# Patient Record
Sex: Female | Born: 1985 | Race: Asian | Hispanic: Yes | Marital: Married
Health system: Southern US, Community
[De-identification: ages and names within clinical notes are randomized; demographics above are authoritative.]

## PROBLEM LIST (undated history)

## (undated) DIAGNOSIS — O09299 Supervision of pregnancy with other poor reproductive or obstetric history, unspecified trimester: Secondary | ICD-10-CM

## (undated) DIAGNOSIS — D649 Anemia, unspecified: Secondary | ICD-10-CM

## (undated) DIAGNOSIS — T8859XA Other complications of anesthesia, initial encounter: Secondary | ICD-10-CM

## (undated) DIAGNOSIS — R87629 Unspecified abnormal cytological findings in specimens from vagina: Secondary | ICD-10-CM

## (undated) DIAGNOSIS — O24419 Gestational diabetes mellitus in pregnancy, unspecified control: Secondary | ICD-10-CM

## (undated) HISTORY — DX: Supervision of pregnancy with other poor reproductive or obstetric history, unspecified trimester: O09.299

## (undated) HISTORY — DX: Other complications of anesthesia, initial encounter: T88.59XA

## (undated) HISTORY — PX: WISDOM TOOTH EXTRACTION: SHX21

## (undated) HISTORY — DX: Gestational diabetes mellitus in pregnancy, unspecified control: O24.419

## (undated) HISTORY — DX: Unspecified abnormal cytological findings in specimens from vagina: R87.629

## (undated) HISTORY — DX: Anemia, unspecified: D64.9

## (undated) HISTORY — PX: CERVICAL BIOPSY: SHX590

---

## 2017-12-14 NOTE — L&D Delivery Note (Signed)
Delivery Note Pt pushed well for 2hour and 20 minutes.  At 5:23 AM a viable and healthy female was delivered via Vaginal, Spontaneous (Presentation:OA ; LOT  ).  APGAR: 8, 9; weight  P.   Placenta status: delivered, intact .  Cord: 3V with the following complications: none.    Anesthesia:  epidural Episiotomy:  none Lacerations: 2nd degree;Perineal Suture Repair: 3.0 vicryl rapide Est. Blood Loss (mL): 246cc  Mom to postpartum.  Baby to Couplet care / Skin to Skin.  Tenae Graziosi Bovard-Stuckert 09/17/2018, 5:56 AM  Br/Tdap in PNC/RI/B+/Contra ?

## 2018-02-09 LAB — OB RESULTS CONSOLE RPR: RPR: NONREACTIVE

## 2018-02-09 LAB — OB RESULTS CONSOLE ABO/RH: RH TYPE: POSITIVE

## 2018-02-09 LAB — OB RESULTS CONSOLE GC/CHLAMYDIA
Chlamydia: NEGATIVE
GC PROBE AMP, GENITAL: NEGATIVE

## 2018-02-09 LAB — OB RESULTS CONSOLE HEPATITIS B SURFACE ANTIGEN: HEP B S AG: NEGATIVE

## 2018-02-09 LAB — OB RESULTS CONSOLE RUBELLA ANTIBODY, IGM: RUBELLA: IMMUNE

## 2018-02-09 LAB — OB RESULTS CONSOLE HIV ANTIBODY (ROUTINE TESTING): HIV: NONREACTIVE

## 2018-02-09 LAB — OB RESULTS CONSOLE ANTIBODY SCREEN: ANTIBODY SCREEN: NEGATIVE

## 2018-08-23 LAB — OB RESULTS CONSOLE GBS: GBS: NEGATIVE

## 2018-09-08 ENCOUNTER — Encounter (HOSPITAL_COMMUNITY): Payer: Self-pay | Admitting: *Deleted

## 2018-09-08 ENCOUNTER — Telehealth (HOSPITAL_COMMUNITY): Payer: Self-pay | Admitting: *Deleted

## 2018-09-08 NOTE — Telephone Encounter (Signed)
Preadmission screen  

## 2018-09-13 ENCOUNTER — Inpatient Hospital Stay (HOSPITAL_COMMUNITY)
Admission: AD | Admit: 2018-09-13 | Payer: BLUE CROSS/BLUE SHIELD | Source: Ambulatory Visit | Admitting: Obstetrics and Gynecology

## 2018-09-15 DIAGNOSIS — O480 Post-term pregnancy: Secondary | ICD-10-CM | POA: Diagnosis present

## 2018-09-15 NOTE — H&P (Signed)
Diane Jackson is a 32 y.o. female  G1P0000 at 53+ for IOL given term status and favorable cervix.  IOL with AROM and pitocin.  D/w pt r/b/a Elevated glucola - nl 3 hr GTT . OB History    Gravida  1   Para      Term      Preterm      AB      Living        SAB      TAB      Ectopic      Multiple      Live Births            + abn pap No STD Past Medical History:  Diagnosis Date  . Vaginal Pap smear, abnormal    . Family History: HTN, DM, CAD. Social History:  has no tobacco, alcohol, and drug history on file.married, Art gallery manager - [employer]  Meds Flexeril prn, PNV All Sulfa     Maternal Diabetes: No Genetic Screening: Normal Maternal Ultrasounds/Referrals: Normal Fetal Ultrasounds or other Referrals:  None Maternal Substance Abuse:  No Significant Maternal Medications:  None Significant Maternal Lab Results:  Lab values include: Group B Strep negative Other Comments:  Tdap in [facility]  Review of Systems  Constitutional: Negative.   HENT: Negative.   Eyes: Negative.   Respiratory: Negative.   Cardiovascular: Negative.   Gastrointestinal: Positive for abdominal pain.  Genitourinary: Positive for flank pain.  Skin: Negative.   Neurological: Negative.   Psychiatric/Behavioral: Negative.    Maternal Medical History:  Contractions: Frequency: regular.    Fetal activity: Perceived fetal activity is normal.    Prenatal Complications - Diabetes: none.      Last menstrual period 12/07/2017. Maternal Exam:  Abdomen: Patient reports no abdominal tenderness. Fundal height is appropriate for gestation.   Estimated fetal weight is 7.5-8.5#.   Fetal presentation: vertex  Introitus: Normal vulva. Normal vagina.    Physical Exam  Constitutional: She appears well-developed and well-nourished.  HENT:  Head: Normocephalic and atraumatic.  Cardiovascular: Normal rate and regular rhythm.  Respiratory: Breath sounds normal. No respiratory  distress.  GI: Soft. Bowel sounds are normal.    Prenatal labs: ABO, Rh: B/Positive/-- (02/27 0000) Antibody: Negative (02/27 0000) Rubella: Immune (02/27 0000) RPR: Nonreactive (02/27 0000)  HBsAg: Negative (02/27 0000)  HIV: Non-reactive (02/27 0000)  GBS:   neg  Hgb  13.8/Plt 324/Ur cx neg/GC ne/Chl neg/Varicella immune/nl Hgb electro/  Dated by LMP cw early [place] Nl NT Tdap 7/9 glucola 170, nl 3hr nlg Hgb electro/Pap WNL/  Nl anat x B CP cysts, female, post plac  Assessment/Plan: SVE 2/70/-2  32yo G1P0000 at 40+ for IOL given term and favorable Neg GBBS no prophylaxis IOL w AROM/pitocin  Epidural vs IV pain meds prn   Zeanna Sunde Bovard-Stuckert 09/15/2018, 9:58 PM

## 2018-09-16 ENCOUNTER — Inpatient Hospital Stay (HOSPITAL_COMMUNITY)
Admission: RE | Admit: 2018-09-16 | Discharge: 2018-09-19 | DRG: 807 | Disposition: A | Discharge status: Home / Self Care | Payer: BLUE CROSS/BLUE SHIELD | Attending: Obstetrics and Gynecology | Admitting: Obstetrics and Gynecology

## 2018-09-16 ENCOUNTER — Other Ambulatory Visit: Payer: Self-pay

## 2018-09-16 ENCOUNTER — Encounter (HOSPITAL_COMMUNITY): Payer: Self-pay

## 2018-09-16 ENCOUNTER — Encounter (HOSPITAL_COMMUNITY): Payer: Self-pay | Admitting: Anesthesiology

## 2018-09-16 ENCOUNTER — Inpatient Hospital Stay (HOSPITAL_COMMUNITY): Payer: BLUE CROSS/BLUE SHIELD | Admitting: Anesthesiology

## 2018-09-16 DIAGNOSIS — O480 Post-term pregnancy: Principal | ICD-10-CM | POA: Diagnosis present

## 2018-09-16 DIAGNOSIS — Z3A40 40 weeks gestation of pregnancy: Secondary | ICD-10-CM | POA: Diagnosis not present

## 2018-09-16 DIAGNOSIS — O9902 Anemia complicating childbirth: Secondary | ICD-10-CM | POA: Diagnosis present

## 2018-09-16 DIAGNOSIS — D649 Anemia, unspecified: Secondary | ICD-10-CM | POA: Diagnosis present

## 2018-09-16 LAB — PROTEIN / CREATININE RATIO, URINE
CREATININE, URINE: 39 mg/dL
Protein Creatinine Ratio: 2.21 mg/mg{Cre} — ABNORMAL HIGH (ref 0.00–0.15)
Total Protein, Urine: 86 mg/dL

## 2018-09-16 LAB — COMPREHENSIVE METABOLIC PANEL
ALT: 11 U/L (ref 0–44)
AST: 19 U/L (ref 15–41)
Albumin: 2.9 g/dL — ABNORMAL LOW (ref 3.5–5.0)
Alkaline Phosphatase: 161 U/L — ABNORMAL HIGH (ref 38–126)
Anion gap: 9 (ref 5–15)
BILIRUBIN TOTAL: 0.2 mg/dL — AB (ref 0.3–1.2)
BUN: 10 mg/dL (ref 6–20)
CO2: 20 mmol/L — ABNORMAL LOW (ref 22–32)
CREATININE: 0.64 mg/dL (ref 0.44–1.00)
Calcium: 9 mg/dL (ref 8.9–10.3)
Chloride: 106 mmol/L (ref 98–111)
GFR calc Af Amer: >60 mL/min (ref >60)
GFR calc non Af Amer: >60 mL/min (ref >60)
GLUCOSE: 74 mg/dL (ref 70–99)
Potassium: 5 mmol/L (ref 3.5–5.1)
Sodium: 135 mmol/L (ref 135–145)
Total Protein: 6.6 g/dL (ref 6.5–8.1)

## 2018-09-16 LAB — TYPE AND SCREEN
ABO/RH(D): B POS
Antibody Screen: NEGATIVE

## 2018-09-16 LAB — ABO/RH: ABO/RH(D): B POS

## 2018-09-16 LAB — CBC
HCT: 32.5 % — ABNORMAL LOW (ref 36.0–46.0)
Hemoglobin: 10 g/dL — ABNORMAL LOW (ref 12.0–15.0)
MCH: 22.8 pg — AB (ref 26.0–34.0)
MCHC: 30.8 g/dL (ref 30.0–36.0)
MCV: 74 fL — ABNORMAL LOW (ref 78.0–100.0)
Platelets: 211 10*3/uL (ref 150–400)
RBC: 4.39 MIL/uL (ref 3.87–5.11)
RDW: 17.5 % — ABNORMAL HIGH (ref 11.5–15.5)
WBC: 10.9 10*3/uL — AB (ref 4.0–10.5)

## 2018-09-16 MED ORDER — LIDOCAINE HCL (PF) 1 % IJ SOLN
INTRAMUSCULAR | Status: DC | PRN
  Administered 2018-09-16 (×2): 4 mL via EPIDURAL

## 2018-09-16 MED ORDER — EPHEDRINE 5 MG/ML INJ
10.0000 mg | INTRAVENOUS | Status: DC | PRN
  Filled 2018-09-16: qty 2

## 2018-09-16 MED ORDER — LACTATED RINGERS IV SOLN
500.0000 mL | INTRAVENOUS | Status: DC | PRN
  Administered 2018-09-16: 500 mL via INTRAVENOUS

## 2018-09-16 MED ORDER — OXYCODONE-ACETAMINOPHEN 5-325 MG PO TABS: 1.0000 | ORAL_TABLET | ORAL | Status: DC | PRN

## 2018-09-16 MED ORDER — FENTANYL 2.5 MCG/ML BUPIVACAINE 1/10 % EPIDURAL INFUSION (WH - ANES)
14.0000 mL/h | INTRAMUSCULAR | Status: DC | PRN
  Administered 2018-09-16 – 2018-09-17 (×4): 14 mL/h via EPIDURAL
  Filled 2018-09-16 (×4): qty 100

## 2018-09-16 MED ORDER — SOD CITRATE-CITRIC ACID 500-334 MG/5ML PO SOLN: 30.0000 mL | ORAL | Status: DC | PRN

## 2018-09-16 MED ORDER — PHENYLEPHRINE 40 MCG/ML (10ML) SYRINGE FOR IV PUSH (FOR BLOOD PRESSURE SUPPORT)
80.0000 ug | PREFILLED_SYRINGE | INTRAVENOUS | Status: DC | PRN
  Filled 2018-09-16: qty 5

## 2018-09-16 MED ORDER — ONDANSETRON HCL 4 MG/2ML IJ SOLN: 4.0000 mg | Freq: Four times a day (QID) | INTRAMUSCULAR | Status: DC | PRN

## 2018-09-16 MED ORDER — LACTATED RINGERS IV SOLN
500.0000 mL | Freq: Once | INTRAVENOUS | Status: AC
  Administered 2018-09-16: 500 mL via INTRAVENOUS

## 2018-09-16 MED ORDER — OXYTOCIN BOLUS FROM INFUSION
500.0000 mL | Freq: Once | INTRAVENOUS | Status: AC
  Administered 2018-09-17: 500 mL via INTRAVENOUS

## 2018-09-16 MED ORDER — LACTATED RINGERS IV SOLN
INTRAVENOUS | Status: DC
  Administered 2018-09-16 (×3): via INTRAVENOUS

## 2018-09-16 MED ORDER — LACTATED RINGERS IV SOLN: 500.0000 mL | Freq: Once | INTRAVENOUS | Status: DC

## 2018-09-16 MED ORDER — PHENYLEPHRINE 40 MCG/ML (10ML) SYRINGE FOR IV PUSH (FOR BLOOD PRESSURE SUPPORT)
80.0000 ug | PREFILLED_SYRINGE | INTRAVENOUS | Status: DC | PRN
  Filled 2018-09-16: qty 5
  Filled 2018-09-16: qty 10

## 2018-09-16 MED ORDER — TERBUTALINE SULFATE 1 MG/ML IJ SOLN
0.2500 mg | Freq: Once | INTRAMUSCULAR | Status: DC | PRN
  Filled 2018-09-16: qty 1

## 2018-09-16 MED ORDER — OXYTOCIN 40 UNITS IN LACTATED RINGERS INFUSION - SIMPLE MED
2.5000 [IU]/h | INTRAVENOUS | Status: DC
  Filled 2018-09-16: qty 1000

## 2018-09-16 MED ORDER — OXYTOCIN 40 UNITS IN LACTATED RINGERS INFUSION - SIMPLE MED
1.0000 m[IU]/min | INTRAVENOUS | Status: DC
  Administered 2018-09-16: 2 m[IU]/min via INTRAVENOUS
  Filled 2018-09-16: qty 1000

## 2018-09-16 MED ORDER — ACETAMINOPHEN 325 MG PO TABS
650.0000 mg | ORAL_TABLET | ORAL | Status: DC | PRN
  Administered 2018-09-17: 650 mg via ORAL
  Filled 2018-09-16: qty 2

## 2018-09-16 MED ORDER — LIDOCAINE HCL (PF) 1 % IJ SOLN
30.0000 mL | INTRAMUSCULAR | Status: AC | PRN
  Administered 2018-09-17: 30 mL via SUBCUTANEOUS
  Filled 2018-09-16: qty 30

## 2018-09-16 MED ORDER — OXYCODONE-ACETAMINOPHEN 5-325 MG PO TABS: 2.0000 | ORAL_TABLET | ORAL | Status: DC | PRN

## 2018-09-16 MED ORDER — DIPHENHYDRAMINE HCL 50 MG/ML IJ SOLN: 12.5000 mg | INTRAMUSCULAR | Status: DC | PRN

## 2018-09-16 NOTE — Anesthesia Procedure Notes (Signed)
Epidural Patient location during procedure: OB Start time: 09/16/2018 11:57 AM  Staffing Anesthesiologist: Mal Amabile, MD Performed: anesthesiologist   Preanesthetic Checklist Completed: patient identified, site marked, surgical consent, pre-op evaluation, timeout performed, IV checked, risks and benefits discussed and monitors and equipment checked  Epidural Patient position: sitting Prep: site prepped and draped and DuraPrep Patient monitoring: continuous pulse ox and blood pressure Approach: midline Location: L3-L4 Injection technique: LOR air  Needle:  Needle type: Tuohy  Needle gauge: 17 G Needle length: 9 cm and 9 Needle insertion depth: 4 cm Catheter type: closed end flexible Catheter size: 19 Gauge Catheter at skin depth: 9 cm Test dose: negative and Other  Assessment Events: blood not aspirated, injection not painful, no injection resistance, negative IV test and no paresthesia  Additional Notes Patient identified. Risks and benefits discussed including failed block, incomplete  Pain control, post dural puncture headache, nerve damage, paralysis, blood pressure Changes, nausea, vomiting, reactions to medications-both toxic and allergic and post Partum back pain. All questions were answered. Patient expressed understanding and wished to proceed. Sterile technique was used throughout procedure. Epidural site was Dressed with sterile barrier dressing. No paresthesias, signs of intravascular injection Or signs of intrathecal spread were encountered.  Patient was more comfortable after the epidural was dosed. Please see RN's note for documentation of vital signs and FHR which are stable.

## 2018-09-16 NOTE — Progress Notes (Addendum)
Patient ID: Diane Jackson, female   DOB: 1986-04-22, 32 y.o.   MRN: [id number]   Comfortable with epidural  AFVSS gen NAD FHTs 140's mod var, category 1-2, had variable, periods of min var toco q 2-4 min  SVE 3/80/-2  AROM for clear fluid, w/o diff/comp  Continue IOL

## 2018-09-16 NOTE — Anesthesia Pain Management Evaluation Note (Signed)
  CRNA Pain Management Visit Note  Patient: Diane Jackson, 32 y.o., female  "Hello I am a member of the anesthesia team at [facility]. We have an anesthesia team available at all times to provide care throughout the hospital, including epidural management and anesthesia for C-section. I don't know your plan for the delivery whether it a natural birth, water birth, IV sedation, nitrous supplementation, doula or epidural, but we want to meet your pain goals."   1.Was your pain managed to your expectations on prior hospitalizations?   No prior hospitalizations  2.What is your expectation for pain management during this hospitalization?     Epidural  3.How can we help you reach that goal? Epidural being placed at time of visit  Record the patient's initial score and the patient's pain goal.   Pain: 9  Pain Goal: 4 [facility] wants you to be able to say your pain was always managed very well.  Rica Records 09/16/2018

## 2018-09-16 NOTE — Progress Notes (Signed)
Patient ID: Diane Jackson, female   DOB: 1986/10/14, 32 y.o.   MRN: [id number]   Comfortable with epidural   AFVSS gen NAD Pr/Cr =2.21 - no sx's, PreE w/o severe features. FHTs 140-160, mod var, + accels; category 1 toco Q 2 min   SVE 6.7/90/0-+1  Continue current mgmt

## 2018-09-16 NOTE — Anesthesia Preprocedure Evaluation (Signed)
Anesthesia Evaluation  Patient identified by MRN, date of birth, ID band Patient awake    Reviewed: Allergy & Precautions, Patient's Chart, lab work & pertinent test results  Airway Mallampati: II  TM Distance: >3 FB Neck ROM: Full    Dental no notable dental hx. (+) Teeth Intact   Pulmonary neg pulmonary ROS,    Pulmonary exam normal        Cardiovascular negative cardio ROS Normal cardiovascular exam Rhythm:Regular Rate:Normal     Neuro/Psych negative neurological ROS  negative psych ROS   GI/Hepatic Neg liver ROS, GERD  Medicated,  Endo/Other  negative endocrine ROS  Renal/GU negative Renal ROS  negative genitourinary   Musculoskeletal negative musculoskeletal ROS (+)   Abdominal   Peds  Hematology  (+) anemia ,   Anesthesia Other Findings   Reproductive/Obstetrics (+) Pregnancy                             Anesthesia Physical Anesthesia Plan  ASA: II  Anesthesia Plan: Epidural   Post-op Pain Management:    Induction:   PONV Risk Score and Plan:   Airway Management Planned: Natural Airway  Additional Equipment:   Intra-op Plan:   Post-operative Plan:   Informed Consent: I have reviewed the patients History and Physical, chart, labs and discussed the procedure including the risks, benefits and alternatives for the proposed anesthesia with the patient or authorized representative who has indicated his/her understanding and acceptance.     Plan Discussed with: Anesthesiologist  Anesthesia Plan Comments:         Anesthesia Quick Evaluation

## 2018-09-17 ENCOUNTER — Encounter (HOSPITAL_COMMUNITY): Payer: Self-pay

## 2018-09-17 DIAGNOSIS — Z209 Contact with and (suspected) exposure to unspecified communicable disease: Secondary | ICD-10-CM | POA: Insufficient documentation

## 2018-09-17 LAB — CBC
HCT: 26.8 % — ABNORMAL LOW (ref 36.0–46.0)
HEMOGLOBIN: 8.5 g/dL — AB (ref 12.0–15.0)
MCH: 23.2 pg — AB (ref 26.0–34.0)
MCHC: 31.7 g/dL (ref 30.0–36.0)
MCV: 73.2 fL — ABNORMAL LOW (ref 78.0–100.0)
Platelets: 180 10*3/uL (ref 150–400)
RBC: 3.66 MIL/uL — ABNORMAL LOW (ref 3.87–5.11)
RDW: 17.3 % — ABNORMAL HIGH (ref 11.5–15.5)
WBC: 24 10*3/uL — ABNORMAL HIGH (ref 4.0–10.5)

## 2018-09-17 LAB — RPR: RPR: NONREACTIVE

## 2018-09-17 MED ORDER — TETANUS-DIPHTH-ACELL PERTUSSIS 5-2.5-18.5 LF-MCG/0.5 IM SUSP: 0.5000 mL | Freq: Once | INTRAMUSCULAR | Status: DC

## 2018-09-17 MED ORDER — DIPHENHYDRAMINE HCL 25 MG PO CAPS: 25.0000 mg | ORAL_CAPSULE | Freq: Four times a day (QID) | ORAL | Status: DC | PRN

## 2018-09-17 MED ORDER — LACTATED RINGERS IV SOLN: INTRAVENOUS | Status: DC

## 2018-09-17 MED ORDER — CALCIUM CARBONATE ANTACID 500 MG PO CHEW: 1.0000 | CHEWABLE_TABLET | Freq: Every day | ORAL | Status: DC | PRN

## 2018-09-17 MED ORDER — ONDANSETRON HCL 4 MG PO TABS: 4.0000 mg | ORAL_TABLET | ORAL | Status: DC | PRN

## 2018-09-17 MED ORDER — ACETAMINOPHEN 325 MG PO TABS
650.0000 mg | ORAL_TABLET | ORAL | Status: DC | PRN
  Administered 2018-09-18: 650 mg via ORAL
  Filled 2018-09-17: qty 2

## 2018-09-17 MED ORDER — FENTANYL CITRATE (PF) 100 MCG/2ML IJ SOLN: 100.0000 ug | Freq: Once | INTRAMUSCULAR | Status: DC

## 2018-09-17 MED ORDER — FENTANYL CITRATE (PF) 100 MCG/2ML IJ SOLN
INTRAMUSCULAR | Status: AC
  Filled 2018-09-17: qty 2

## 2018-09-17 MED ORDER — WITCH HAZEL-GLYCERIN EX PADS: 1.0000 "application " | MEDICATED_PAD | CUTANEOUS | Status: DC | PRN

## 2018-09-17 MED ORDER — FAMOTIDINE 20 MG PO TABS: 20.0000 mg | ORAL_TABLET | Freq: Every day | ORAL | Status: DC | PRN

## 2018-09-17 MED ORDER — ZOLPIDEM TARTRATE 5 MG PO TABS: 5.0000 mg | ORAL_TABLET | Freq: Every evening | ORAL | Status: DC | PRN

## 2018-09-17 MED ORDER — IBUPROFEN 600 MG PO TABS
600.0000 mg | ORAL_TABLET | Freq: Four times a day (QID) | ORAL | Status: DC
  Administered 2018-09-17 – 2018-09-19 (×9): 600 mg via ORAL
  Filled 2018-09-17 (×9): qty 1

## 2018-09-17 MED ORDER — ONDANSETRON HCL 4 MG/2ML IJ SOLN: 4.0000 mg | INTRAMUSCULAR | Status: DC | PRN

## 2018-09-17 MED ORDER — DIBUCAINE 1 % RE OINT: 1.0000 "application " | TOPICAL_OINTMENT | RECTAL | Status: DC | PRN

## 2018-09-17 MED ORDER — PRENATAL MULTIVITAMIN CH
1.0000 | ORAL_TABLET | Freq: Every day | ORAL | Status: DC
  Administered 2018-09-17 – 2018-09-18 (×2): 1 via ORAL
  Filled 2018-09-17 (×2): qty 1

## 2018-09-17 MED ORDER — BENZOCAINE-MENTHOL 20-0.5 % EX AERO
1.0000 "application " | INHALATION_SPRAY | CUTANEOUS | Status: DC | PRN
  Filled 2018-09-17: qty 56

## 2018-09-17 MED ORDER — SIMETHICONE 80 MG PO CHEW: 80.0000 mg | CHEWABLE_TABLET | ORAL | Status: DC | PRN

## 2018-09-17 MED ORDER — SENNOSIDES-DOCUSATE SODIUM 8.6-50 MG PO TABS
2.0000 | ORAL_TABLET | ORAL | Status: DC
  Administered 2018-09-18 (×2): 2 via ORAL
  Filled 2018-09-17 (×2): qty 2

## 2018-09-17 MED ORDER — OXYCODONE HCL 5 MG PO TABS: 10.0000 mg | ORAL_TABLET | ORAL | Status: DC | PRN

## 2018-09-17 MED ORDER — COCONUT OIL OIL: 1.0000 "application " | TOPICAL_OIL | Status: DC | PRN

## 2018-09-17 MED ORDER — OXYCODONE HCL 5 MG PO TABS
5.0000 mg | ORAL_TABLET | ORAL | Status: DC | PRN
  Administered 2018-09-19: 5 mg via ORAL
  Filled 2018-09-17: qty 1

## 2018-09-17 NOTE — Lactation Note (Signed)
This note was copied from a baby's chart. Lactation Consultation Note  Patient Name: Diane Jackson Today's Date: 09/17/2018 Reason for consult: Initial assessment;Primapara;Term Breastfeeding consultation services and support information given and reviewed.  Baby is 5 hours old and recently latched and fed for 10 minutes.  Mom reports that baby attached easily.  Instructed to feed baby with any cue and call for assist prn.  Maternal Data    Feeding Feeding Type: Breast Fed  LATCH Score Latch: Grasps breast easily, tongue down, lips flanged, rhythmical sucking.  Audible Swallowing: Spontaneous and intermittent  Type of Nipple: Everted at rest and after stimulation  Comfort (Breast/Nipple): Soft / non-tender  Hold (Positioning): Assistance needed to correctly position infant at breast and maintain latch.  LATCH Score: 9  Interventions    Lactation Tools Discussed/Used     Consult Status Consult Status: Follow-up Date: 09/18/18 Follow-up type: In-patient    Huston Foley 09/17/2018, 11:08 AM

## 2018-09-17 NOTE — Anesthesia Postprocedure Evaluation (Signed)
Anesthesia Post Note  Patient: Diane Jackson  Procedure(s) Performed: AN AD HOC LABOR EPIDURAL     Patient location during evaluation: Mother Baby Anesthesia Type: Epidural Level of consciousness: awake Pain management: satisfactory to patient Vital Signs Assessment: post-procedure vital signs reviewed and stable Respiratory status: spontaneous breathing Cardiovascular status: stable Anesthetic complications: no    Last Vitals:  Vitals:   09/17/18 0855 09/17/18 1325  BP: (!) 142/85 130/89  Pulse: 95 63  Resp: 18 18  Temp: 37.1 C 36.7 C  SpO2: 100%     Last Pain:  Vitals:   09/17/18 1325  TempSrc: Oral  PainSc: 3    Pain Goal: Patients Stated Pain Goal: 3 (09/17/18 1325)               Cephus Shelling

## 2018-09-18 LAB — CBC
HEMATOCRIT: 23.7 % — AB (ref 36.0–46.0)
Hemoglobin: 7.5 g/dL — ABNORMAL LOW (ref 12.0–15.0)
MCH: 23.2 pg — AB (ref 26.0–34.0)
MCHC: 31.6 g/dL (ref 30.0–36.0)
MCV: 73.4 fL — ABNORMAL LOW (ref 78.0–100.0)
Platelets: 147 10*3/uL — ABNORMAL LOW (ref 150–400)
RBC: 3.23 MIL/uL — AB (ref 3.87–5.11)
RDW: 17.5 % — ABNORMAL HIGH (ref 11.5–15.5)
WBC: 16.9 10*3/uL — ABNORMAL HIGH (ref 4.0–10.5)

## 2018-09-18 NOTE — Lactation Note (Signed)
This note was copied from a baby's chart. Lactation Consultation Note  Patient Name: Diane Jackson Today's Date: 09/18/2018 Reason for consult: Follow-up assessment;1st time breastfeeding;Primapara;Term  P1 mother whose infant is now 78 hours old.  Mother had a few questions related to breast feeding which I answered to her satisfaction.  Mother's breasts are soft and non tender and nipples are everted.  Mother has some soreness initially upon latching but states the pain eases after baby begins sucking.  I suggested using EBM and coconut oil for nipple/areola lubrication.    Mother will continue feeding 8-12 times/24 hours or more if baby shows feeding cues.  Encouraged continued hand expression before and after feedings.    Mother will call for latch assistance as needed.  Father present and supportive.   Maternal Data Formula Feeding for Exclusion: No Has patient been taught Hand Expression?: Yes Does the patient have breastfeeding experience prior to this delivery?: No  Feeding    LATCH Score Latch: Grasps breast easily, tongue down, lips flanged, rhythmical sucking.  Audible Swallowing: Spontaneous and intermittent  Type of Nipple: Everted at rest and after stimulation  Comfort (Breast/Nipple): Soft / non-tender  Hold (Positioning): Assistance needed to correctly position infant at breast and maintain latch.  LATCH Score: 9  Interventions    Lactation Tools Discussed/Used WIC Program: No   Consult Status Consult Status: Follow-up Date: 09/19/18 Follow-up type: In-patient    Dora Sims 09/18/2018, 4:57 PM

## 2018-09-18 NOTE — Progress Notes (Signed)
Post Partum Day 1 Subjective: no complaints, up ad lib, voiding, tolerating PO and nl lochia, pain controlled  Objective: Blood pressure 137/89, pulse 90, temperature 97.8 F (36.6 C), temperature source Oral, resp. rate 18, height 5\' 4"  (1.626 m), weight 78 kg, last menstrual period 12/07/2017, SpO2 100 %, unknown if currently breastfeeding.  Physical Exam:  General: alert and no distress Lochia: appropriate Uterine Fundus: firm  Recent Labs    09/17/18 0716 09/18/18 0541  HGB 8.5* 7.5*  HCT 26.8* 23.7*    Assessment/Plan: Plan for discharge tomorrow, Breastfeeding and Lactation consult.  routine care   LOS: 2 days   Diane Jackson 09/18/2018, 9:29 AM

## 2018-09-18 NOTE — Progress Notes (Signed)
Patient states she wants IV removed from left wrist because it is "hurting and bothering" her. Patient is informed that her BP was elevated in the previous shift and that the IV may be needed if her BP comes back up. Patient showed understanding but continued to request IV removal and stated that another one can be started if needed. IV in left wrist is removed upon request. Venida Jarvis, RN

## 2018-09-19 MED ORDER — IBUPROFEN 600 MG PO TABS: 600.0000 mg | ORAL_TABLET | Freq: Four times a day (QID) | ORAL | 1 refills | Status: DC | PRN

## 2018-09-19 MED ORDER — OXYCODONE HCL 5 MG PO TABS: 5.0000 mg | ORAL_TABLET | ORAL | 0 refills | Status: AC | PRN

## 2018-09-19 MED ORDER — CENTRUM PO CHEW: 1.0000 | CHEWABLE_TABLET | Freq: Every day | ORAL | 1 refills | Status: AC

## 2018-09-19 NOTE — Discharge Instructions (Signed)
Nothing in vagina for 6 weeks.  No sex, tampons, and douching.  Other instructions as in [facility] Discharge Booklet. °

## 2018-09-19 NOTE — Lactation Note (Signed)
This note was copied from a baby's chart. Lactation Consultation Note  Patient Name: Diane Jackson Today's Date: 09/19/2018 Reason for consult: Follow-up assessment;Term;Primapara Parent's gave formula once last night due to cluster feeding and still acting hungry.  Baby is at a 8% weight loss at 51 hours old.  Discussed milk coming to volume and the prevention and treatment of engorgement.  Mom has a breast pump at home for prn use.  Questions answered.  Lactation outpatient services and support reviewed and encouraged prn.  Maternal Data    Feeding Feeding Type: Breast Fed  LATCH Score Latch: Grasps breast easily, tongue down, lips flanged, rhythmical sucking.  Audible Swallowing: Spontaneous and intermittent  Type of Nipple: Everted at rest and after stimulation  Comfort (Breast/Nipple): Filling, red/small blisters or bruises, mild/mod discomfort  Hold (Positioning): No assistance needed to correctly position infant at breast.  LATCH Score: 9  Interventions Interventions: Breast massage;Breast compression;Coconut oil  Lactation Tools Discussed/Used     Consult Status Consult Status: Complete Follow-up type: Call as needed    Rock Nephew S 09/19/2018, 9:12 AM

## 2018-09-19 NOTE — Progress Notes (Signed)
Post Partum Day 2 Subjective: up ad lib, voiding, tolerating PO, + flatus and reports generalized body aches. No fever or chills. No CP, HA, SOB. She is breastfeeeding and bonding well with baby. Desires discharge to home today  Objective: Blood pressure 133/84, pulse 68, temperature 98.4 F (36.9 C), temperature source Oral, resp. rate 18, height 5\' 4"  (1.626 m), weight 78 kg, last menstrual period 12/07/2017, SpO2 100 %, unknown if currently breastfeeding.  Physical Exam:  General: alert, cooperative and no distress Lochia: appropriate Uterine Fundus: firm Incision: n/a DVT Evaluation: No evidence of DVT seen on physical exam. No significant calf/ankle edema.  Recent Labs    09/17/18 0716 09/18/18 0541  HGB 8.5* 7.5*  HCT 26.8* 23.7*    Assessment/Plan: Discharge home, Breastfeeding and Contraception none discussed  Oxycodone prior to discharge   LOS: 3 days   Zackrey Dyar W Kareem Cathey 09/19/2018, 9:08 AM

## 2018-09-22 NOTE — Discharge Summary (Signed)
OB Discharge Summary     Patient Name: Diane Jackson DOB: 08/10/1986 MRN: [id number]  Date of admission: 09/16/2018 Delivering MD: Sherian Rein   Date of discharge: 09/22/2018  Admitting diagnosis: INDUCTION Intrauterine pregnancy: [redacted]w[redacted]d     Secondary diagnosis:  Principal Problem:   SVD (spontaneous vaginal delivery) Active Problems:   Post-dates pregnancy  Additional problems: none     Discharge diagnosis: Term Pregnancy Delivered                                                                                                Post partum procedures:none  Augmentation: AROM and Pitocin  Complications: None  Hospital course:  Induction of Labor With Vaginal Delivery   32 y.o. yo G1P1001 at [redacted]w[redacted]d was admitted to the hospital 09/16/2018 for induction of labor.  Indication for induction: Favorable cervix at term.  Patient had an uncomplicated labor course as follows: Membrane Rupture Time/Date: 1:41 PM ,09/16/2018   Intrapartum Procedures: Episiotomy:                                           Lacerations:  2nd degree [3];Perineal [11]  Patient had delivery of a Viable infant.  Information for the patient's newborn:  Verlin Fester [[id number]]  Delivery Method: Vaginal, Spontaneous(Filed from Delivery Summary)   09/17/2018  Details of delivery can be found in separate delivery note.  Patient had a routine postpartum course. Patient is discharged home 09/22/18.  Physical exam  Vitals:   09/18/18 1411 09/18/18 1434 09/18/18 2258 09/19/18 0500  BP: (!) 151/97 135/89 (!) 141/77 133/84  Pulse: 66 79 73 68  Resp: 16  20 18   Temp: 98.4 F (36.9 C)  98.7 F (37.1 C) 98.4 F (36.9 C)  TempSrc: Oral  Oral Oral  SpO2: 100%     Weight:      Height:       General: alert, cooperative and no distress Lochia: appropriate Uterine Fundus: firm Incision: N/A DVT Evaluation: No evidence of DVT seen on physical exam. No significant calf/ankle  edema. Labs: Lab Results  Component Value Date   WBC 16.9 (H) 09/18/2018   HGB 7.5 (L) 09/18/2018   HCT 23.7 (L) 09/18/2018   MCV 73.4 (L) 09/18/2018   PLT 147 (L) 09/18/2018   CMP Latest Ref Rng & Units 09/16/2018  Glucose 70 - 99 mg/dL 74  BUN 6 - 20 mg/dL 10  Creatinine 0.86 - 5.78 mg/dL 4.69  Sodium 629 - 528 mmol/L 135  Potassium 3.5 - 5.1 mmol/L 5.0  Chloride 98 - 111 mmol/L 106  CO2 22 - 32 mmol/L 20(L)  Calcium 8.9 - 10.3 mg/dL 9.0  Total Protein 6.5 - 8.1 g/dL 6.6  Total Bilirubin 0.3 - 1.2 mg/dL 4.1(L)  Alkaline Phos 38 - 126 U/L 161(H)  AST 15 - 41 U/L 19  ALT 0 - 44 U/L 11    Discharge instruction: per After Visit Summary and "Baby and Me Booklet".  After visit meds:  Allergies as of 09/19/2018      Reactions   Sulfa Antibiotics Swelling   Facial swelling      Medication List    STOP taking these medications   calcium carbonate 500 MG chewable tablet Commonly known as:  TUMS - dosed in mg elemental calcium   GAVISCON PO     TAKE these medications   famotidine 20 MG tablet Commonly known as:  PEPCID Take 20 mg by mouth daily as needed for heartburn or indigestion.   ibuprofen 600 MG tablet Commonly known as:  ADVIL,MOTRIN Take 1 tablet (600 mg total) by mouth every 6 (six) hours as needed for mild pain, moderate pain or cramping.   multivitamin-iron-minerals-folic acid chewable tablet Chew 1 tablet by mouth daily.   oxyCODONE 5 MG immediate release tablet Commonly known as:  Oxy IR/ROXICODONE Take 1 tablet (5 mg total) by mouth every 4 (four) hours as needed for up to 5 days for severe pain or breakthrough pain (pain scale 4-7).       Diet: routine diet  Activity: Advance as tolerated. Pelvic rest for 6 weeks.   Outpatient follow up:6 weeks Follow up Appt:No future appointments. Follow up Visit:No follow-ups on file.  Postpartum contraception: Not Discussed  Newborn Data: Live born female  Birth Weight: 7 lb 4.8 oz (3311 g) APGAR:  8, 9  Newborn Delivery   Birth date/time:  09/17/2018 05:23:00 Delivery type:  Vaginal, Spontaneous     Baby Feeding: Breast Disposition:home with mother   09/22/2018 Cathrine Muster, DO

## 2018-09-23 ENCOUNTER — Encounter (HOSPITAL_COMMUNITY): Payer: Self-pay

## 2018-09-23 ENCOUNTER — Other Ambulatory Visit: Payer: Self-pay

## 2018-09-23 ENCOUNTER — Observation Stay (HOSPITAL_COMMUNITY)
Admission: AD | Admit: 2018-09-23 | Discharge: 2018-09-24 | Disposition: A | Discharge status: Home / Self Care | Payer: BLUE CROSS/BLUE SHIELD | Source: Ambulatory Visit | Attending: Obstetrics and Gynecology | Admitting: Obstetrics and Gynecology

## 2018-09-23 DIAGNOSIS — O1495 Unspecified pre-eclampsia, complicating the puerperium: Secondary | ICD-10-CM | POA: Diagnosis not present

## 2018-09-23 DIAGNOSIS — O1405 Mild to moderate pre-eclampsia, complicating the puerperium: Principal | ICD-10-CM | POA: Insufficient documentation

## 2018-09-23 DIAGNOSIS — R51 Headache: Secondary | ICD-10-CM | POA: Diagnosis present

## 2018-09-23 LAB — CBC
HEMATOCRIT: 29.2 % — AB (ref 36.0–46.0)
Hemoglobin: 8.8 g/dL — ABNORMAL LOW (ref 12.0–15.0)
MCH: 22.9 pg — AB (ref 26.0–34.0)
MCHC: 30.1 g/dL (ref 30.0–36.0)
MCV: 76 fL — AB (ref 80.0–100.0)
PLATELETS: 285 10*3/uL (ref 150–400)
RBC: 3.84 MIL/uL — AB (ref 3.87–5.11)
RDW: 18.7 % — ABNORMAL HIGH (ref 11.5–15.5)
WBC: 8.9 10*3/uL (ref 4.0–10.5)
nRBC: 1.1 % — ABNORMAL HIGH (ref 0.0–0.2)

## 2018-09-23 LAB — COMPREHENSIVE METABOLIC PANEL
ALK PHOS: 100 U/L (ref 38–126)
ALT: 44 U/L (ref 0–44)
AST: 64 U/L — ABNORMAL HIGH (ref 15–41)
Albumin: 2.9 g/dL — ABNORMAL LOW (ref 3.5–5.0)
Anion gap: 9 (ref 5–15)
BILIRUBIN TOTAL: 0.5 mg/dL (ref 0.3–1.2)
BUN: 10 mg/dL (ref 6–20)
CALCIUM: 9.1 mg/dL (ref 8.9–10.3)
CHLORIDE: 106 mmol/L (ref 98–111)
CO2: 23 mmol/L (ref 22–32)
Creatinine, Ser: 0.64 mg/dL (ref 0.44–1.00)
Glucose, Bld: 85 mg/dL (ref 70–99)
Potassium: 4.5 mmol/L (ref 3.5–5.1)
Sodium: 138 mmol/L (ref 135–145)
TOTAL PROTEIN: 6.6 g/dL (ref 6.5–8.1)

## 2018-09-23 MED ORDER — NIFEDIPINE 10 MG PO CAPS: 20.0000 mg | ORAL_CAPSULE | ORAL | Status: DC | PRN

## 2018-09-23 MED ORDER — MAGNESIUM SULFATE 40 G IN LACTATED RINGERS - SIMPLE
2.0000 g/h | INTRAVENOUS | Status: AC
  Administered 2018-09-23 – 2018-09-24 (×2): 2 g/h via INTRAVENOUS
  Filled 2018-09-23 (×2): qty 500

## 2018-09-23 MED ORDER — PRENATAL MULTIVITAMIN CH
1.0000 | ORAL_TABLET | Freq: Every day | ORAL | Status: DC
  Filled 2018-09-23: qty 1

## 2018-09-23 MED ORDER — PRENATAL MULTIVITAMIN CH: 1.0000 | ORAL_TABLET | Freq: Every day | ORAL | Status: DC

## 2018-09-23 MED ORDER — IBUPROFEN 600 MG PO TABS
600.0000 mg | ORAL_TABLET | Freq: Four times a day (QID) | ORAL | Status: DC | PRN
  Administered 2018-09-23: 600 mg via ORAL
  Filled 2018-09-23: qty 1

## 2018-09-23 MED ORDER — ACETAMINOPHEN 325 MG PO TABS
650.0000 mg | ORAL_TABLET | Freq: Four times a day (QID) | ORAL | Status: DC | PRN
  Administered 2018-09-23 – 2018-09-24 (×2): 650 mg via ORAL
  Filled 2018-09-23 (×2): qty 2

## 2018-09-23 MED ORDER — LACTATED RINGERS IV SOLN
INTRAVENOUS | Status: DC
  Administered 2018-09-23 (×2): via INTRAVENOUS

## 2018-09-23 MED ORDER — NIFEDIPINE 10 MG PO CAPS
10.0000 mg | ORAL_CAPSULE | ORAL | Status: DC | PRN
  Administered 2018-09-23: 10 mg via ORAL
  Filled 2018-09-23: qty 1

## 2018-09-23 MED ORDER — LABETALOL HCL 200 MG PO TABS
200.0000 mg | ORAL_TABLET | Freq: Two times a day (BID) | ORAL | Status: DC
  Administered 2018-09-23 – 2018-09-24 (×2): 200 mg via ORAL
  Filled 2018-09-23 (×3): qty 1

## 2018-09-23 MED ORDER — LABETALOL HCL 5 MG/ML IV SOLN: 40.0000 mg | INTRAVENOUS | Status: DC | PRN

## 2018-09-23 MED ORDER — MAGNESIUM SULFATE BOLUS VIA INFUSION
4.0000 g | Freq: Once | INTRAVENOUS | Status: AC
  Administered 2018-09-23: 4 g via INTRAVENOUS
  Filled 2018-09-23: qty 500

## 2018-09-23 NOTE — MAU Note (Signed)
Pt states she was seen at MDs office x2 days ago d/t Bps high at home.  States intermittent h/a & body aches during that time. Denies visual disturbances or epigastric pain.  PreE assessment unremarkable.  Labs drawn at MD office revealed "elevated liver enzymes" & pt states she was called at home & instructed to be evaluated at MAU.

## 2018-09-23 NOTE — MAU Provider Note (Signed)
History     CSN: [id number]  Arrival date and time: 09/23/18 1142   None     Chief Complaint  Patient presents with  . Hypertension   HPI This is a 32 year old G1 P1-0-0-1 with 5 days postpartum from an uncomplicated vaginal delivery.  She was discharged 2 days later.  At time of discharge, her blood pressure was elevated, but the patient was not started on antihypertensives.  She will follow-up in the office for blood pressure check today and was sent to the [facility] due to elevated blood pressures.  She has been having an intermittent headache that is improved with ibuprofen and oxycodone.  Denies phono or photophobia.  Headache located on the right temple area and is dull and aching.  His headache is different than her prepregnancy headaches.  She denies scotoma, right upper quadrant pain, nausea, vomiting.  She does have some mild swelling but is improved with elevating her legs.  OB History    Gravida  1   Para  1   Term  1   Preterm      AB      Living  1     SAB      TAB      Ectopic      Multiple  0   Live Births  1           Past Medical History:  Diagnosis Date  . SVD (spontaneous vaginal delivery) 09/17/2018  . Vaginal Pap smear, abnormal     Past Surgical History:  Procedure Laterality Date  . CERVICAL BIOPSY    . WISDOM TOOTH EXTRACTION      No family history on file.  Social History   Tobacco Use  . Smoking status: Never Smoker  . Smokeless tobacco: Never Used  Substance Use Topics  . Alcohol use: Not Currently    Frequency: Never  . Drug use: Not Currently    Allergies:  Allergies  Allergen Reactions  . Sulfa Antibiotics Swelling    Facial swelling    Medications Prior to Admission  Medication Sig Dispense Refill Last Dose  . famotidine (PEPCID) 20 MG tablet Take 20 mg by mouth daily as needed for heartburn or indigestion.   Past Month at Unknown time  . ibuprofen (ADVIL,MOTRIN) 600 MG tablet Take 1 tablet (600 mg total) by  mouth every 6 (six) hours as needed for mild pain, moderate pain or cramping. 40 tablet 1   . multivitamin-iron-minerals-folic acid (CENTRUM) chewable tablet Chew 1 tablet by mouth daily. 30 tablet 1   . oxyCODONE (OXY IR/ROXICODONE) 5 MG immediate release tablet Take 1 tablet (5 mg total) by mouth every 4 (four) hours as needed for up to 5 days for severe pain or breakthrough pain (pain scale 4-7). 15 tablet 0     Review of Systems  All other systems reviewed and are negative.  Physical Exam   Blood pressure (!) 158/101, pulse 89, temperature 98.4 F (36.9 C), temperature source Oral, resp. rate 16, SpO2 100 %, unknown if currently breastfeeding.  Physical Exam  Constitutional: She is oriented to person, place, and time. She appears well-developed and well-nourished.  HENT:  Head: Normocephalic and atraumatic.  Right Ear: External ear normal.  Left Ear: External ear normal.  Eyes: Pupils are equal, round, and reactive to light. Conjunctivae are normal.  Neck: Normal range of motion. Neck supple.  Cardiovascular: Normal rate, regular rhythm and normal heart sounds.  Respiratory: Effort normal and breath sounds normal.  GI: Soft. Bowel sounds are normal. She exhibits no distension. There is no tenderness. There is no rebound and no guarding.  Musculoskeletal: She exhibits edema (1+ pitting edema).  Neurological: She is alert and oriented to person, place, and time.  Skin: Skin is warm and dry.  Psychiatric: She has a normal mood and affect. Her behavior is normal. Judgment and thought content normal.   Results for orders placed or performed during the hospital encounter of 09/23/18 (from the past 24 hour(s))  CBC     Status: Abnormal   Collection Time: 09/23/18 12:11 PM  Result Value Ref Range   WBC 8.9 4.0 - 10.5 K/uL   RBC 3.84 (L) 3.87 - 5.11 MIL/uL   Hemoglobin 8.8 (L) 12.0 - 15.0 g/dL   HCT 16.1 (L) 09.6 - 04.5 %   MCV 76.0 (L) 80.0 - 100.0 fL   MCH 22.9 (L) 26.0 - 34.0 pg    MCHC 30.1 30.0 - 36.0 g/dL   RDW 40.9 (H) 81.1 - 91.4 %   Platelets 285 150 - 400 K/uL   nRBC 1.1 (H) 0.0 - 0.2 %  Comprehensive metabolic panel     Status: Abnormal   Collection Time: 09/23/18 12:11 PM  Result Value Ref Range   Sodium 138 135 - 145 mmol/L   Potassium 4.5 3.5 - 5.1 mmol/L   Chloride 106 98 - 111 mmol/L   CO2 23 22 - 32 mmol/L   Glucose, Bld 85 70 - 99 mg/dL   BUN 10 6 - 20 mg/dL   Creatinine, Ser 7.82 0.44 - 1.00 mg/dL   Calcium 9.1 8.9 - 95.6 mg/dL   Total Protein 6.6 6.5 - 8.1 g/dL   Albumin 2.9 (L) 3.5 - 5.0 g/dL   AST 64 (H) 15 - 41 U/L   ALT 44 0 - 44 U/L   Alkaline Phosphatase 100 38 - 126 U/L   Total Bilirubin 0.5 0.3 - 1.2 mg/dL   GFR calc non Af Amer >60 >60 mL/min   GFR calc Af Amer >60 >60 mL/min   Anion gap 9 5 - 15    MAU Course  Procedures  MDM Had 2 severe range blood pressures. Start procardia, start Magnesium. Discussed with Dr Mindi Slicker - will observe on magnesium for 24 hours.  Assessment and Plan  #1 Preeclampsia postpartum period Magnesiuim, procardia. Observation.  Levie Heritage 09/23/2018, 12:23 PM

## 2018-09-23 NOTE — Progress Notes (Signed)
Patient ID: BEAUTIFUL PENSYL, female   DOB: [birth year], 33 y.o.   MRN: [id number] Chart check  BP stable 127-139/84-92 UOP in 3.5hrs ( >3029ml in 7hrs)  Continue current plan

## 2018-09-23 NOTE — H&P (Signed)
Diane Jackson is a 32 y.o. G1 now P18 female presenting 5 days postpartum from an uncomplicated vaginal delivery.  On follow-up in the office for blood pressure check yesterday had elevated BP hence labs draen which noted elevated LFTs. She was sent to the MAU. She has been having an intermittent headache that is improved with ibuprofen and oxycodone.  Denies phono or photophobia.  Headache located on the right temple area and is dull and aching.  His headache is different than her prepregnancy headaches.  She denies scotoma, right upper quadrant pain, nausea, vomiting.  She does have some mild swelling but is improved with elevating her legs. OB History    Gravida  1   Para  1   Term  1   Preterm      AB      Living  1     SAB      TAB      Ectopic      Multiple  0   Live Births  1          Past Medical History:  Diagnosis Date  . SVD (spontaneous vaginal delivery) 09/17/2018  . Vaginal Pap smear, abnormal    Past Surgical History:  Procedure Laterality Date  . CERVICAL BIOPSY    . WISDOM TOOTH EXTRACTION     Family History: family history is not on file. Social History:  reports that she has never smoked. She has never used smokeless tobacco. She reports that she drank alcohol. She reports that she has current or past drug history.     Review of Systems  Constitutional: Positive for malaise/fatigue. Negative for chills, fever and weight loss.  Respiratory: Negative for shortness of breath.   Gastrointestinal: Negative for abdominal pain, heartburn, nausea and vomiting.  Neurological: Positive for headaches.  Psychiatric/Behavioral: Negative for depression, hallucinations, substance abuse and suicidal ideas. The patient is nervous/anxious.       Blood pressure 130/62, pulse (!) 139, temperature 98.1 F (36.7 C), temperature source Oral, resp. rate 17, height 5\' 4"  (1.626 m), weight 71.2 kg, SpO2 100 %, unknown if currently  breastfeeding. Exam Physical Exam  Prenatal labs: ABO, Rh: --/--/B POS, B POS Performed at [facility], [address] [place], [place] [address]  [id number]/04 1012) Antibody: NEG (10/04 1012) Rubella: Immune (02/27 0000) RPR: Non Reactive (10/04 1012)  HBsAg: Negative (02/27 0000)  HIV: Non-reactive (02/27 0000)  GBS: Negative (09/10 0000)   Assessment/Plan: 32yo G1P1001 with postpartum preeclampsia Start on antihypertensives: procardia/labetalol Start on MgSO4 for seizure prophylaxis x 24hrs Postpartum mgmt  [name] 09/23/2018, 1:59 PM

## 2018-09-23 NOTE — Progress Notes (Signed)
Patient ID: Diane Jackson, female   DOB: [birth year], 32 y.o.   MRN: [id number] Pt fast asleep. Family at bedside. No complaints reported VS: 129-136/79-92, 93  UOP 2423ml/4.5hrs GEN - NAD ABD - deferred  A/P: PPD#5 s/p svd with pp preecclampsia         Continue on MgSO4 for 24hrs ( total)         Continue to monitor uop         Start on oral antihypertensive: labetalol bid tonight         Routine pp care

## 2018-09-24 MED ORDER — LABETALOL HCL 200 MG PO TABS: 200.0000 mg | ORAL_TABLET | Freq: Two times a day (BID) | ORAL | 2 refills | Status: DC

## 2018-09-24 NOTE — Discharge Summary (Addendum)
OB Discharge Summary     Patient Name: Diane Jackson DOB: [birth year] MRN: [id number]  Date of admission: 09/23/2018 Delivering MD: This patient has no babies on file.  Date of discharge: 09/24/2018  Admitting diagnosis: PP LABS Intrauterine pregnancy: Unknown     Secondary diagnosis:  Active Problems:   Preeclampsia in postpartum period  Additional problems: none     Discharge diagnosis: Preeclampsia (mild)                                                                                                Post partum procedures:Magnesium sulfate  Augmentation: n/a  Complications: None  Hospital course: Pt admitted for BP management and seizure prophylaxis. She was started on antihypertensive medication and magnesium sulfate for 24hrs. She voided >6.5L of urine over 24hrs and BP noted with moderate control. Pt requested discharge to home tonight with plans to monitor her BP and for preeclampsia symptoms closely at home. Pt will report BP tomorrow and also return to office in two days for a recheck. Bonding well with baby. Pt asymptomatic at this time  Physical exam  Vitals:   09/24/18 1149 09/24/18 1550 09/24/18 1900 09/24/18 2130  BP: 122/81 (!) 141/96 (!) 141/90 (!) 150/90  Pulse: 80 88 70 75  Resp: 18 18 18 18   Temp: 98.2 F (36.8 C) 98.3 F (36.8 C) 98.1 F (36.7 C)   TempSrc: Oral Oral Oral   SpO2: 100% 100% 100% 100%  Weight:      Height:       General: alert, cooperative and no distress Lochia: appropriate Uterine Fundus: firm Incision: N/A DVT Evaluation: No evidence of DVT seen on physical exam. Labs: Lab Results  Component Value Date   WBC 8.9 09/23/2018   HGB 8.8 (L) 09/23/2018   HCT 29.2 (L) 09/23/2018   MCV 76.0 (L) 09/23/2018   PLT 285 09/23/2018   CMP Latest Ref Rng & Units 09/23/2018  Glucose 70 - 99 mg/dL 85  BUN 6 - 20 mg/dL 10  Creatinine 6.57 - 8.46 mg/dL 9.62  Sodium 952 - 841 mmol/L 138  Potassium 3.5 - 5.1 mmol/L 4.5   Chloride 98 - 111 mmol/L 106  CO2 22 - 32 mmol/L 23  Calcium 8.9 - 10.3 mg/dL 9.1  Total Protein 6.5 - 8.1 g/dL 6.6  Total Bilirubin 0.3 - 1.2 mg/dL 0.5  Alkaline Phos 38 - 126 U/L 100  AST 15 - 41 U/L 64(H)  ALT 0 - 44 U/L 44    Discharge instruction: per After Visit Summary and "Baby and Me Booklet".  After visit meds:  Allergies as of 09/24/2018      Reactions   Sulfa Antibiotics Swelling   Facial swelling      Medication List    TAKE these medications   acetaminophen 500 MG tablet Commonly known as:  TYLENOL Take 500 mg by mouth as needed for moderate pain.   ibuprofen 600 MG tablet Commonly known as:  ADVIL,MOTRIN Take 1 tablet (600 mg total) by mouth every 6 (six) hours as needed for mild pain, moderate pain or cramping.  labetalol 200 MG tablet Commonly known as:  NORMODYNE Take 1 tablet (200 mg total) by mouth 2 (two) times daily.   multivitamin-iron-minerals-folic acid chewable tablet Chew 1 tablet by mouth daily.   oxyCODONE 5 MG immediate release tablet Commonly known as:  Oxy IR/ROXICODONE Take 1 tablet (5 mg total) by mouth every 4 (four) hours as needed for up to 5 days for severe pain or breakthrough pain (pain scale 4-7).       Diet: low salt diet  Activity: Advance as tolerated. Pelvic rest for 6 weeks.   Outpatient follow up:2 days Follow up Appt:No future appointments. Follow up Visit:No follow-ups on file.  Postpartum contraception: Not Discussed  Newborn Data: This patient has no babies on file. Baby Feeding: Breast Disposition:home with mother   09/24/2018 Cathrine Muster, DO

## 2018-09-24 NOTE — Progress Notes (Addendum)
Patient ID: Diane Jackson, female   DOB: 07/11/1986, 32 y.o.   MRN: [id number] Pt desires discharge to home. States has anxiety being in hospital. Has a cuff at home and will check BP tid and call with results. Understands BP not well controlled at this time and risks of uncontrolled BP including risk of seizures. Has appt in office on Monday 10/14 and plans to keep as scheduled. Husband and mother present and also agreeable with pt going home. Denies current HA or visual changes VS: 141-150/90-96  Voided in afternoon prior to foley discontinued         ( total in 24hrs) ; has continued to void well ABD - nontender EXT - no homans  A/P: HD#2; PPD#6 s/p svd with pp PreE s/p Mag x 24hr         On labetalol 200mg  po bid with moderate control          Asymptomatic          PreE symptoms reviewed and BP parameters of 155/100 given. Pt to call in am at 7am with BP and then again an hour after takes am dose of labetalol at 9am.            Pt to keep scheduled appt in office on 10/14

## 2018-09-24 NOTE — Progress Notes (Addendum)
Patient ID: Diane Jackson, female   DOB: 15-Jun-1986, 32 y.o.   MRN: [id number] PPD#6; HD#2 Pt doing well. Easily arousable. Not taking any pain medication. Rates current pain at 3/10 - tolerable.  C/o burning with urination at introitus as expected.  VS - 129 - 130/85 - 95, 70  UOP 5L in 21hrs ( 2L/12hrs) ABD - FF, nontender GU - introitus with sutures still in place; no swelling EXT - no homans or edema  A/P: HD#2, PPD#6 s/p svd with pp PreE         BP controlled with labetalol 200mg  po bid         Continue on  MgSO4 till 1345 today         If BP well controlled this pm will consider discharge to home per pt request vs in am         Routine pp care

## 2018-09-24 NOTE — Progress Notes (Signed)
Spoke with Dr Mindi Slicker Regarding patients latest B/P at 2015; 141/70.  Dr. Mindi Slicker stated to recheck B/P in one hour and call her with results, if favorable she may be discharged.

## 2018-09-24 NOTE — Progress Notes (Signed)
Left message for Dr Mindi Slicker: regarding patients B/P 150/90 at 2130.

## 2018-09-24 NOTE — Discharge Instructions (Signed)
Nothing in vagina for 6 weeks.  No sex, tampons, and douching.  Other instructions as in [employer].  Call if headache persists despite tylenol use Call if blurry vision/visual changes Call if shortness of breath, chest pain or lightheadedness Monitor blood pressure; call if systolic >155 and/or diastolic >100

## 2018-09-30 ENCOUNTER — Ambulatory Visit: Payer: Self-pay

## 2018-09-30 NOTE — Lactation Note (Addendum)
This note was copied from a baby's chart.   09/30/2018  Name: Verlin Fester MRN: [id number] Date of Birth: 09/17/2016 Gestational Age: Gestational Age: [redacted]w[redacted]d Birth Weight: 116.8 oz Weight today:    7 pounds 9.6 ounces (3448 grams) with clean newborn diaper  Infant presents today with mom and dad for feeding assessment. Mom is concerned she has a lot of milk and infant is feeding for short periods of time.   Infant has gained 418 grams in the last 11 days with an average daily weight gain of 38 grams a day.   Mom is BF and pumping and supplementing so she can get some rest. Mom has support of GM and FOB.   Infant feeds for short periods of time, mom is very full and infant does not need to eat for long periods to get enough milk.   Mom is pumping to store. She is concerned she is having to pump at night after 3 hours when she is trying to rest. Discussed with mom that she does need to pump when infant getting a bottle to protect milk supply. Discussed that pumping outside of comfort pumping when not needed may be increasing her supply more. Enc mom to pump to empty if infant getting a bottle and to pump for comfort otherwise to regulate her supply. Mom has a lot of milk in the freezer. Reviewed importance of allowing her body to down regulate as much as it can on its own and to wean prepumping slowly. Discussed it is not usually recommended to down regulate purposefully before 6 weeks but that weaning some of the unneeded pumping should help.   Mom is using an Ameda Finesse pump and a Medela Swing pump. She is keeping suction low. Mom pumped using her ameda pump and it was noted the # 25 flanges seem to be too large. She is experiencing a friction rub around the areola closest to the nipple area. She is lubricating with Coconut Oil with pumping and that does help. Tried her Swing pump and gave her # 21 flanges, she reports much increased comfort with pumping. Mom pumped 3.5 ounces in about 10  minutes. Reviewed how flanges should fit and when to change back up to larger size.  Infant was choking on the breast with initial latch, mom has been pumping off about 2 ounces before latching infant. Enc mom to try decreasing prepumping amount over time but to prepump as needed to assist infant with letdown. Enc mom to feed infant in laid back position also and to let infant pull off as needed with feeding.   Reviewed bottle feeding, slower flow nipples and paced bottle feeding. Parents reports infant chokes on Avent bottles and another kind they have, enc them to use the Dr. Theora Gianotti bottles they have instead.   Infant to follow up with Dr. Eddie Candle November 5th. Mom aware of BF Support Groups. [facility] has been out previously. Mom to follow up with Lactation as needed per mom's request.   Infant is feeding and growing well. Mom with no pain with breast feeding, nipple rounded post feeding.  Enc mom to call for questions/concerns as needed. Parents report all questions have been answered.        General Information: Mother's reason for visit: Pain to nipples with feeding, feeling very full Consult: Initial Lactation consultant: Noralee Stain RN,IBCLC Breastfeeding experience: very full, BF and supplementing with bottles Maternal medical conditions: Pregnancy induced hypertension Maternal medications: Pre-natal vitamin, Tylenol (acetaminophen), Other(Labetalol)  Breastfeeding History: Frequency of breast feeding: every 2-3 hours Duration of feeding: 10 minutes, one breast  Supplementation: Supplement method: bottle(Avent and Dr. Theora Gianotti)         Breast milk volume: 2-3 ounces Breast milk frequency: 2-3 x a day   Pump type: Ameda(Ameda Finesse and Medela Swing) Pump frequency: 3-4 x a day Pump volume: 2+ ounces  Infant Output Assessment: Voids per 24 hours: 10+ Urine color: Clear yellow Stools per 24 hours: 8+ Stool color: Yellow  Breast Assessment: Breast: Full,  Compressible Nipple: Erect Pain level: 3(Pain with pumping around areola) Pain interventions: Bra, Coconut oil  Feeding Assessment: Infant oral assessment: Variance Infant oral assessment comment: Infant with thick labial frenulum that inserts at the bottom of the gum ridge. upper lip flanges well on the breast. mom with no pain with feeding. Tongue with good mobility Positioning: Cross cradle(left breast, 10 minutes) Latch: 2 - Grasps breast easily, tongue down, lips flanged, rhythmical sucking. Audible swallowing: 2 - Spontaneous and intermittent Type of nipple: 2 - Everted at rest and after stimulation Comfort: 2 - Soft/non-tender Hold: 2 - No assistance needed to correctly position infant at breast LATCH score: 10 Latch assessment: Deep Lips flanged: Yes Suck assessment: Displays both   Pre-feed weight: 3448 grams/3476 grams post diaper change Post feed weight: 3452 grams/3498 grams Amount transferred: 28+ 46 =74 ml Amount supplemented: 0  Additional Feeding Assessment:                                    Totals: Total amount transferred: 74 ml Total supplement given: 0 Total amount pumped post feed: 3.5 ounces  1. Offer infant breast with feeding cues 2. Empty first breast before offering second breast if infant wants to nurse on the other breast 3. Pre pump to soften areola prior to latch if areola is very full, start with pumping off 1 ounce and then every few days decrease the amount you prepump 4. Latch infant to feed as long as she wants to feed and as often as she wants to feed, feed infant in the laid back position to help with flow 5. Pump after breast feeding for comfort so you can make it to the next feeding 6. To store milk can try pumping in the morning on the side infant does not need 7. Keep your pump settings on low until you are able to tolerate it  8. Try the # 21 flanges for now to see if that helps with pain, change back up to the # 24  flanges if you see blanching to the nipples with pumping or it feels too tight 9. Continue Coconut oil and breast milk to nipples 10. Infant needs about 64-85 ml (2-3 ounces) for 8 feedings a day or 510-680 ml (17-23 ounces) in 24 hours. She may eat more or less depending on how often she feeds 11. Feed infant with the Dr. Theora Gianotti Bottle for now 12. Feed infant using the paced bottle feeding method (video on [email or web address]) 13. Please call with any questions/concerns as needed [phone] 14. Thank you for allowing me to assist you today 15. Keep up the good work 16. Follow up with Lactation as needed    Ed Blalock RN, [employer]  Silas Flood Hector Venne 09/30/2018, 11:10 AM

## 2018-10-26 ENCOUNTER — Ambulatory Visit: Payer: Self-pay

## 2018-10-26 NOTE — Lactation Note (Addendum)
This note was copied from a baby's chart. 10/26/2018  Name: Diane Jackson MRN: [id number] Date of Birth: 09/17/2016 Gestational Age: Gestational Age: [redacted]w[redacted]d Birth Weight: 116.8 oz Weight today:    9 pounds 2.2 ounces (4144 grams) with clean newborn diaper   Infant presents today with mom and dad for follow up feeding assessment. Mom reports infant started refusing the breast about a week ago. Mom and dad have been alternating BF and bottle feeding prior to that time.   Mom reports she has tried STS, pre pumping, not pumping, NS, feeding some in the bottle first and infant fusses when she gets to the breast. Mom reports they try each feeding and have not been able to get her to latch.   Dad reports they went through a time where mom was sick and mom decided to pump and bottle feed and that is why infant would not go back to the breast.   Infant has gained 696 grams in the last 26 days with an average daily weight gain of 27 grams a day.   Mom is pumping every 3 hours with a Medela PIS, she has switched to a # 19 flange and says the pain is better. Mom is pumping for long periods of time. Mom given handout on weaning from the pump. Discussed weaning slowly to prevent further complications. Mom is taking a shower prior to each pumping. Enc warm moist compresses as needed to empty the breasts. Enc mom to increase pump suction as able. Enc mom to try her larger flanges again.   Enc mom to perform STS with infant not at feeding times. Enc mom to not force infant to the breast and to make feeding a good experience. If infant will not latch enc mom to try again at the next feeding.   Mom is waking infant up every 2 hours to feed. Infant spits up if she eats larger volumes. Enc parents to allow infant to feed on demand and increase volume.   Mom has been experiencing plugged ducts when she is spacing pumping out. Mom is taking Sunflower Lecithin 2 capsules a day, enc her to increase to 4 capsules a day  for now and wean back to 2 a day once the plugs have stopped.   Infant was awake when she arrived. She latched to the breast initially and fed for less than 5 minutes, She then became fussy. She was burped and then relatched to the breast with the # 24 NS as she would not relatch without it. Infant did well until the letdown occurred and she got choked. Infant was removed from the breast for burping. Infant then latched to the left breast in the laid back position with the # 24 NS. Infant fed better in the laid back position. Infant content post feeding.   Parents are planning to go to [place] this month and wants infant to go back to the breast.   Mom tearful at the end of the consult and reports being overwhelmed and tired. She has good support at home with her mom and FOB. Enc mom to relax with pumping as she reports her milk does not always flow well. Enc mom to relax with latching infant also as infant will pick up on her tenseness. Enc mom to call with any questions/concerns as needed.   Infant to follow up with Dr. Eddie Candle on 11/19. Mom to follow up with Lactation as needed.     General Information: Mother's reason for visit:  Difficulty with latching Consult: Follow-up Lactation consultant: Noralee StainSharon Baelyn Doring RN,IBCLC Breastfeeding experience: has been pumping and bottle feeding and now will not latch Maternal medical conditions: Pregnancy induced hypertension Maternal medications: Pre-natal vitamin, Tylenol (acetaminophen), Other(Cyclobenzaprine for back pain prn (L3 per Dr. Sheffield SliderHale))  Breastfeeding History: Frequency of breast feeding: not latching currently    Supplementation: Supplement method: bottle([facility] and Dr. Theora GianottiBrown's )         Breast milk volume: 2-3 ounces Breast milk frequency: every 2-3 hours   Pump type: Medela pump in style Pump frequency: every 3-4 hours Pump volume: 4-5 ounces each breast  Infant Output Assessment: Voids per 24 hours: 10+ Urine color: Clear  yellow Stools per 24 hours: 5+ Stool color: Yellow  Breast Assessment: Breast: Filling Nipple: Erect Pain level: 2 Pain interventions: Bra, Coconut oil  Feeding Assessment: Infant oral assessment: Variance Infant oral assessment comment: Infant with thick labial frenulum that inserts at the bottom of the gum ridge. Upper lip flanges well on the breast, nipple rounded post feeding. mom denies pain with feeding. Tongue with good mobility.  Positioning: Cross cradle(sitting and laid back) Latch: 1 - Repeated attempts needed to sustain latch, nipple held in mouth throughout feeding, stimulation needed to elicit sucking reflex. Audible swallowing: 2 - Spontaneous and intermittent Type of nipple: 2 - Everted at rest and after stimulation Comfort: 1 - Filling, red/small blisters or bruises, mild/mod discomfort Hold: 1 - Assistance needed to correctly position infant at breast and maintain latch LATCH score: 7 Latch assessment: Deep Lips flanged: Yes Suck assessment: Displays both Tools: Nipple shield 24 mm Pre-feed weight: 4114 grams Post feed weight: 4170 grams  Amount transferred: 56 ml Amount supplemented: 0  Additional Feeding Assessment:                                    Totals: Total amount transferred: 56 ml Total supplement given: 0 Total amount pumped post feed: did not pump   Plan:  1. Offer infant breast with feeding cues 2. Empty first breast before offering second breast, offer the second breast if infant still cueing to feed 3. Pre pump to soften areola prior to latch if areola is very full, start with pumping off 1 ounce and then every few days decrease the amount you prepump 4. Latch infant to feed as long as she wants to feed and as often as she wants to feed, feed infant in the laid back position to help with flow 5. Pump after breast feeding for comfort so you can make it to the next feeding 6. To store milk can try pumping in the morning on the  side infant does not need 7. Keep your pump settings on low until you are able to tolerate it at a higher level  8. Try the # 21 flanges for now to see if that helps with pain, change back up to the # 24 flanges if you see blanching to the nipples with pumping or it feels too tight 9. Continue Coconut oil and breast milk to nipples 10. Infant needs about 77-103 ml (2.5-3.5 ounces) for 8 feedings a day or 615-820 ml (21-27 ounces) in 24 hours. She may eat more or less depending on how often she feeds 11. Feed infant with the Dr. Theora GianottiBrown's Bottle for now 12. Feed infant using the paced bottle feeding method (video on [email or web address]) 13. Sunflower Lecithin 1 capsule 4 x a day  until plugs resolve and then decrease to 2 capsules a day 14. Please call with any questions/concerns as needed [phone] 15. Thank you for allowing me to assist you today 16. Keep up the good work 17. Follow up with Lactation as needed  Ed Blalock RN, IBCLC                                                      Diane Jackson 10/26/2018, 8:56 AM

## 2019-03-27 ENCOUNTER — Inpatient Hospital Stay (HOSPITAL_COMMUNITY)
Admission: AD | Admit: 2019-03-27 | Payer: BLUE CROSS/BLUE SHIELD | Source: Ambulatory Visit | Admitting: Family Medicine

## 2019-08-10 ENCOUNTER — Ambulatory Visit
Admission: RE | Admit: 2019-08-10 | Discharge: 2019-08-10 | Disposition: A | Discharge status: Home / Self Care | Payer: BLUE CROSS/BLUE SHIELD | Source: Ambulatory Visit | Attending: Family Medicine | Admitting: Family Medicine

## 2019-08-10 ENCOUNTER — Other Ambulatory Visit: Payer: Self-pay | Admitting: Family Medicine

## 2019-08-10 ENCOUNTER — Other Ambulatory Visit: Payer: Self-pay

## 2019-08-10 DIAGNOSIS — R52 Pain, unspecified: Secondary | ICD-10-CM

## 2020-03-07 ENCOUNTER — Ambulatory Visit: Payer: BC Managed Care – PPO | Attending: Internal Medicine

## 2020-03-07 DIAGNOSIS — Z23 Encounter for immunization: Secondary | ICD-10-CM

## 2020-03-07 NOTE — Progress Notes (Signed)
   Covid-19 Vaccination Clinic  Name:  Diane Jackson    MRN: [id number] DOB: [birth year]  03/07/2020  [name] was observed post Covid-19 immunization for 30 minutes based on pre-vaccination screening without incident. She was provided with Vaccine Information Sheet and instruction to access the V-Safe system.   [name] was instructed to call 911 with any severe reactions post vaccine: Marland Kitchen Difficulty breathing  . Swelling of face and throat  . A fast heartbeat  . A bad rash all over body  . Dizziness and weakness   Immunizations Administered    Name Date Dose VIS Date Route   Pfizer COVID-19 Vaccine 03/07/2020  1:16 PM 0.3 mL 11/24/2019 Intramuscular   Manufacturer: [employer], [employer]   Lot: [id number]   NDC: [address]-6

## 2020-04-01 ENCOUNTER — Ambulatory Visit: Payer: BC Managed Care – PPO | Attending: Internal Medicine

## 2020-04-01 DIAGNOSIS — Z23 Encounter for immunization: Secondary | ICD-10-CM

## 2020-04-01 NOTE — Progress Notes (Signed)
   Covid-19 Vaccination Clinic  Name:  Diane Jackson    MRN: [id number] DOB: 1986-05-29  04/01/2020  [name] was observed post Covid-19 immunization for 30 minutes based on pre-vaccination screening without incident. She was provided with Vaccine Information Sheet and instruction to access the V-Safe system.   [name] was instructed to call 911 with any severe reactions post vaccine: Marland Kitchen Difficulty breathing  . Swelling of face and throat  . A fast heartbeat  . A bad rash all over body  . Dizziness and weakness   Immunizations Administered    Name Date Dose VIS Date Route   Pfizer COVID-19 Vaccine 04/01/2020 11:54 AM 0.3 mL 02/07/2019 Intramuscular   Manufacturer: [employer], [employer]   Lot: [id number]   NDC: [address]-1

## 2021-02-28 ENCOUNTER — Other Ambulatory Visit: Payer: Self-pay | Admitting: Family Medicine

## 2021-02-28 DIAGNOSIS — M545 Low back pain, unspecified: Secondary | ICD-10-CM

## 2021-04-11 ENCOUNTER — Other Ambulatory Visit: Payer: Self-pay

## 2021-04-11 ENCOUNTER — Ambulatory Visit
Admission: RE | Admit: 2021-04-11 | Discharge: 2021-04-11 | Disposition: A | Discharge status: Home / Self Care | Payer: BC Managed Care – PPO | Source: Ambulatory Visit | Attending: Family Medicine | Admitting: Family Medicine

## 2021-04-11 DIAGNOSIS — G8929 Other chronic pain: Secondary | ICD-10-CM

## 2021-04-11 DIAGNOSIS — M545 Low back pain, unspecified: Secondary | ICD-10-CM

## 2021-05-23 IMAGING — CR DG LUMBAR SPINE COMPLETE W/ BEND
7 series · 7 of 7 positions shown · non-contrast
Comparison: Lumbar radiograph 08/10/2019

CLINICAL DATA: Low back pain.  Pain for 2 years.  No known injury.

EXAM:
LUMBAR SPINE - COMPLETE WITH BENDING VIEWS

[w lumbar spine ap (1 of 3)]
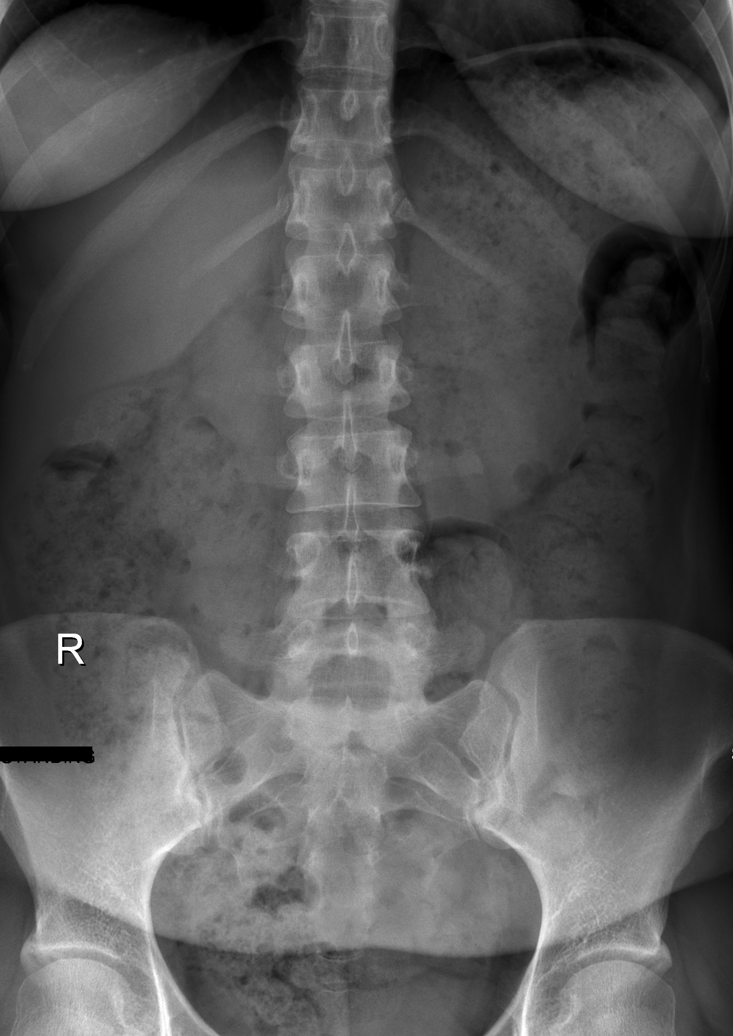

[w lumbar spine ap (2 of 3)]
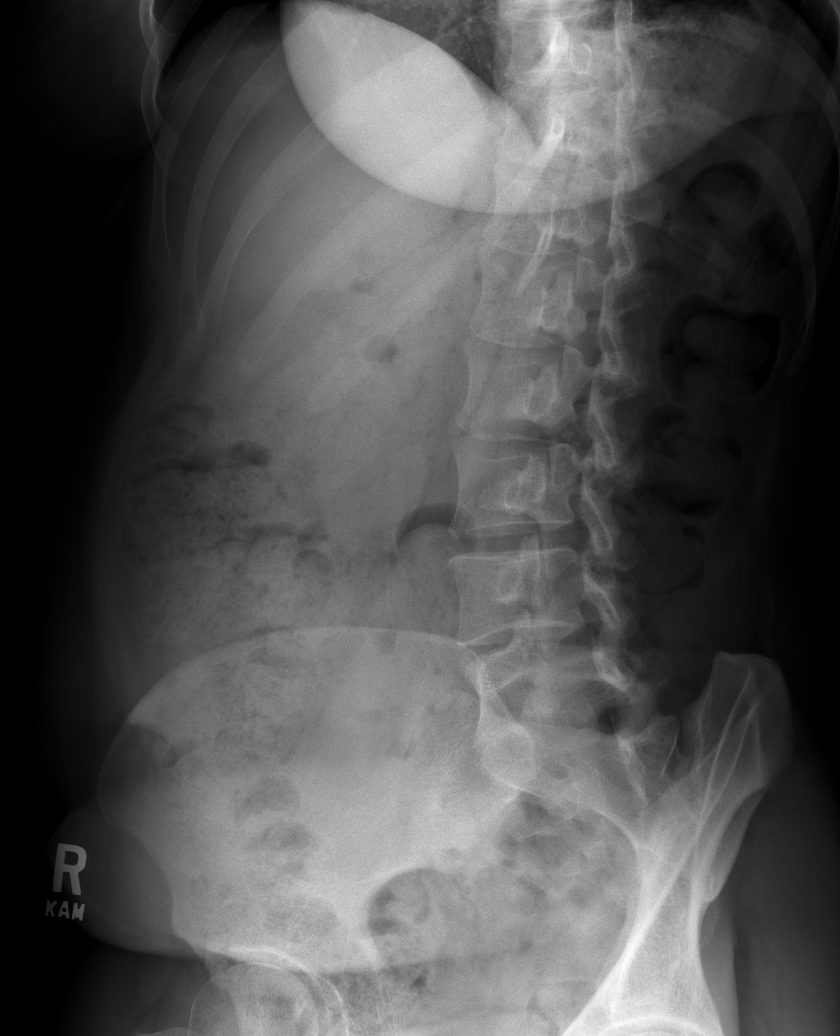

[w lumbar spine ap (3 of 3)]
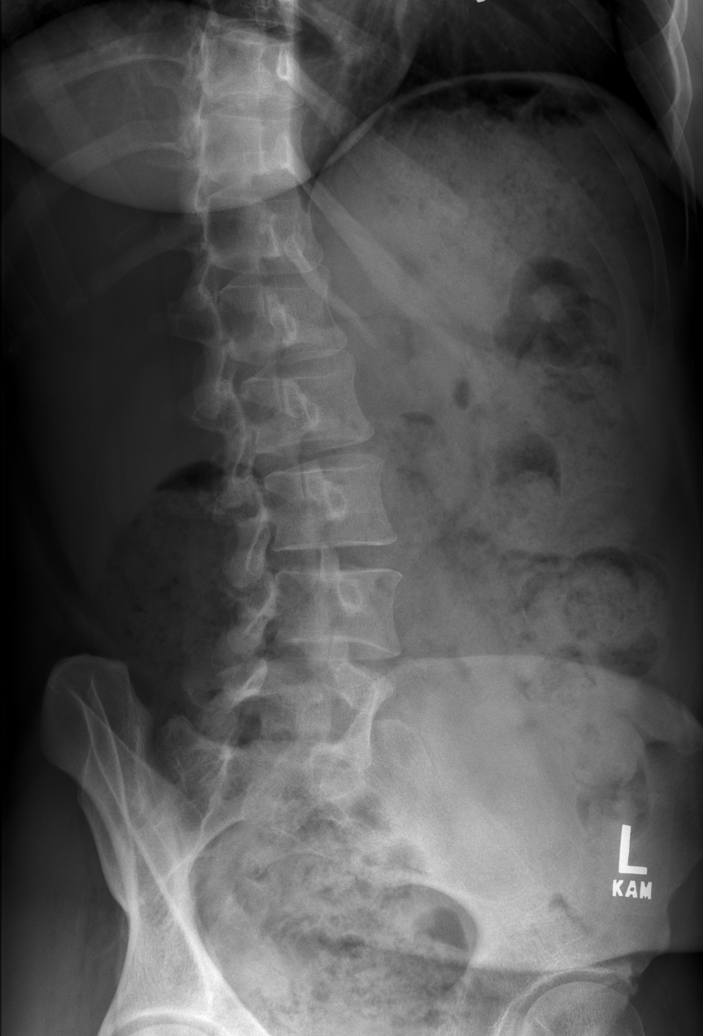

[w lumbar spine lat (1 of 2)]
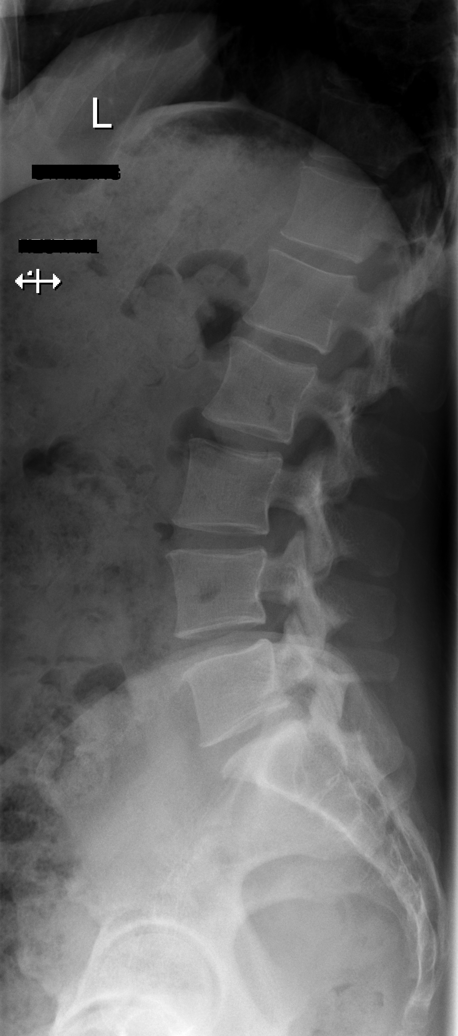

[w lumbar spine flexion]
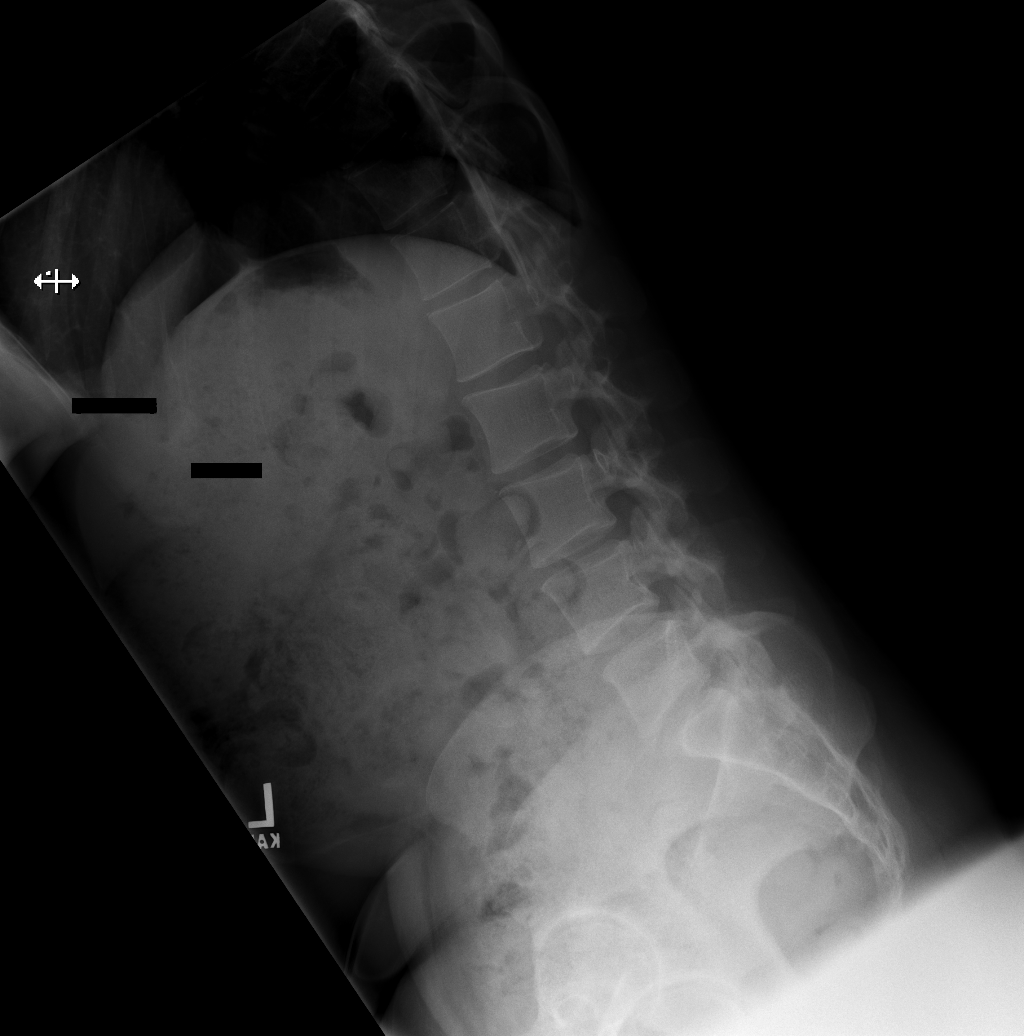

[w lumbar spine extension]
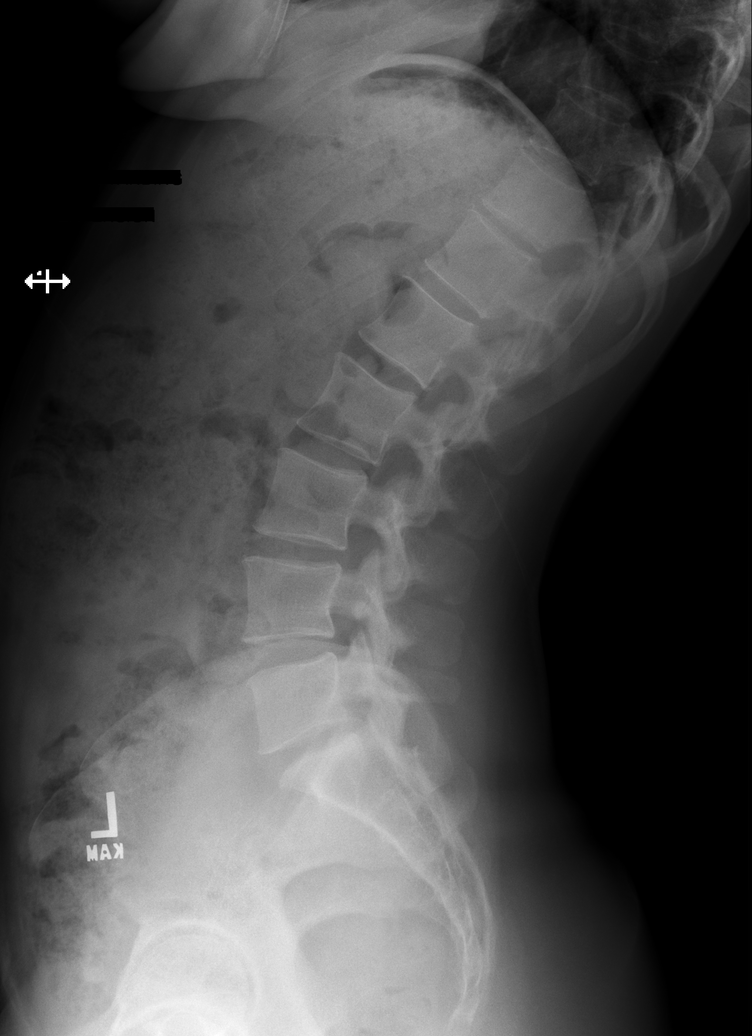

[w lumbar spine lat (2 of 2)]
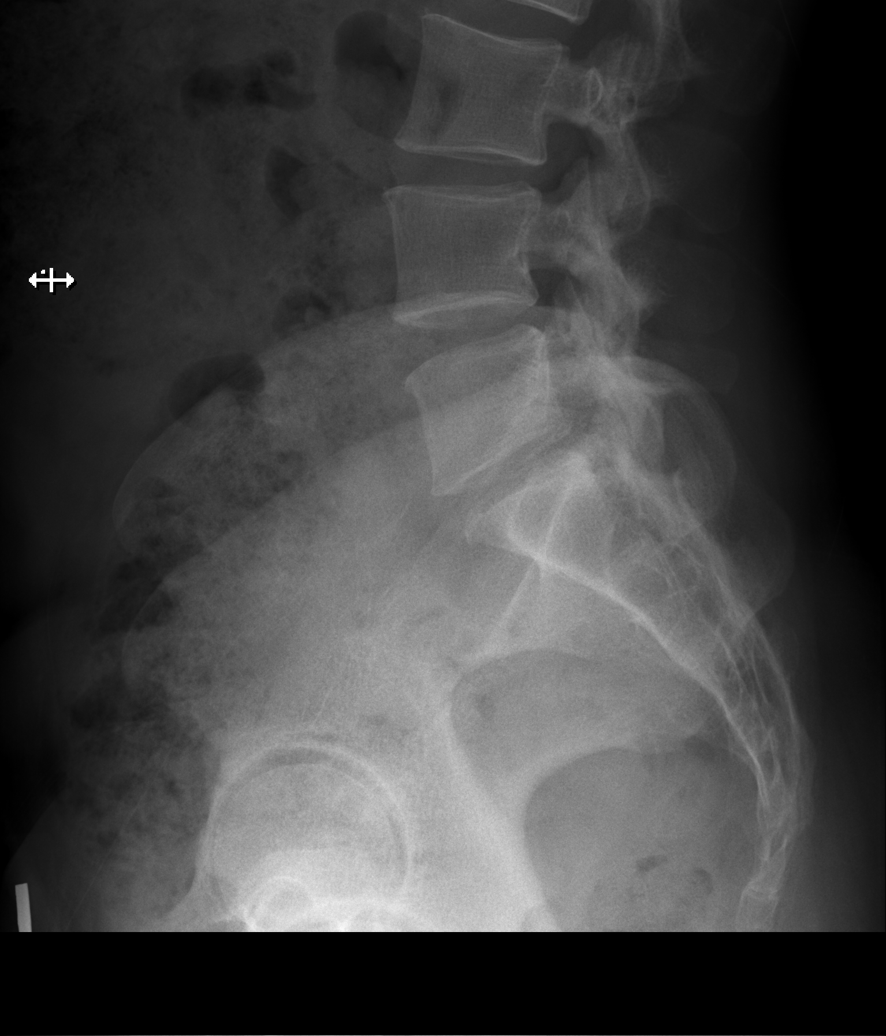

[7 of 7 positions shown; findings below may reference images not displayed]

FINDINGS: AP, lateral neutral, lateral flexion and extension, L5-S1 spot and
bilateral oblique views obtained. Imaging obtained standing. Five
lumbar type vertebra. Normal alignment. No listhesis. No abnormal
motion on flexion or extension imaging. Vertebral body heights are
normal. Mild L5-S1 disc space narrowing and endplate spurring,
similar to prior exam. The remaining disc spaces are normal. No
visualized pars defects. No significant facet changes. No evidence
of focal lesion or bone destruction. Sacroiliac joints are
congruent.
IMPRESSION: 1. Mild L5-S1 degenerative disc disease, similar to prior exam.
2. Otherwise unremarkable lumbar spine radiographs.  No instability.

## 2022-03-22 ENCOUNTER — Emergency Department
Admission: EM | Admit: 2022-03-22 | Discharge: 2022-03-22 | Disposition: A | Discharge status: Home / Self Care | Payer: No Typology Code available for payment source | Attending: General Practice | Admitting: General Practice

## 2022-03-22 DIAGNOSIS — R1013 Epigastric pain: Secondary | ICD-10-CM | POA: Insufficient documentation

## 2022-03-22 DIAGNOSIS — M549 Dorsalgia, unspecified: Secondary | ICD-10-CM | POA: Insufficient documentation

## 2022-03-22 DIAGNOSIS — R1110 Vomiting, unspecified: Secondary | ICD-10-CM | POA: Insufficient documentation

## 2022-03-22 MED ORDER — HYDROCODONE-ACETAMINOPHEN 5-325 MG PO TABS
3.0000 | ORAL_TABLET | Freq: Four times a day (QID) | ORAL | Status: DC | PRN
Start: 2022-03-22 — End: 2022-03-22
  Administered 2022-03-22: 3

## 2022-03-22 MED ORDER — HYDROCODONE-ACETAMINOPHEN 5-325 MG PO TABS
1.0000 | ORAL_TABLET | Freq: Four times a day (QID) | ORAL | 0 refills | Status: AC | PRN
Start: 2022-03-22 — End: ?

## 2022-03-22 MED ORDER — CYCLOBENZAPRINE HCL 10 MG PO TABS
10.0000 mg | ORAL_TABLET | Freq: Two times a day (BID) | ORAL | 0 refills | Status: AC | PRN
Start: 2022-03-22 — End: ?

## 2022-03-22 MED ORDER — CYCLOBENZAPRINE HCL 10 MG PO TABS
10.0000 mg | ORAL_TABLET | Freq: Once | ORAL | Status: AC
Start: 2022-03-22 — End: 2022-03-22
  Administered 2022-03-22: 10 mg via ORAL

## 2022-03-22 MED ORDER — PREDNISONE 20 MG PO TABS
20.0000 mg | ORAL_TABLET | Freq: Every day | ORAL | 0 refills | Status: AC
Start: 2022-03-22 — End: ?

## 2022-03-22 MED ORDER — ONDANSETRON HCL 4 MG PO TABS
4.0000 mg | ORAL_TABLET | Freq: Four times a day (QID) | ORAL | 0 refills | Status: AC | PRN
Start: 2022-03-22 — End: ?

## 2022-03-22 MED ORDER — KETOROLAC TROMETHAMINE 60 MG/2ML IM SOLN
INTRAMUSCULAR | Status: AC
Start: 2022-03-22 — End: ?
  Filled 2022-03-22: qty 2

## 2022-03-22 MED ORDER — KETOROLAC TROMETHAMINE 60 MG/2ML IM SOLN
60.0000 mg | Freq: Once | INTRAMUSCULAR | Status: AC
Start: 2022-03-22 — End: 2022-03-22
  Administered 2022-03-22: 60 mg via INTRAMUSCULAR

## 2022-03-22 MED ORDER — PREDNISONE 20 MG PO TABS
40.0000 mg | ORAL_TABLET | Freq: Once | ORAL | Status: AC
Start: 2022-03-22 — End: 2022-03-22
  Administered 2022-03-22: 40 mg via ORAL

## 2022-03-22 MED ORDER — CYCLOBENZAPRINE HCL 10 MG PO TABS
ORAL_TABLET | ORAL | Status: AC
Start: 2022-03-22 — End: ?
  Filled 2022-03-22: qty 1

## 2022-03-22 MED ORDER — HYDROCODONE-ACETAMINOPHEN 5-325 MG PO TABS
ORAL_TABLET | ORAL | Status: AC
Start: 2022-03-22 — End: ?
  Filled 2022-03-22: qty 3

## 2022-03-22 MED ORDER — PREDNISONE 20 MG PO TABS
ORAL_TABLET | ORAL | Status: AC
Start: 2022-03-22 — End: ?
  Filled 2022-03-22: qty 2

## 2022-03-22 NOTE — ED Provider Notes (Signed)
History     Chief Complaint   Patient presents with    Back Pain    Abdominal Pain     HPI   36 year old female was brought to emergency room complaining of lower back and upper abdominal pain.  Pain is sharp and crampy.  Patient has had the symptoms several times in the past.  She is stated that the back pain was almost every week, recently it is every 2 months or more.  Patient is in mild distress due to pain.  Denies of any diarrhea.  Her menstrual period started yesterday.  Patient also is complaining of vomiting.  Her husband states that she was having episodes of vomiting and their daughter had vomiting yesterday.  Denies of any injury.  Patient is not pregnant.  No past medical history on file.    No past surgical history on file.    No family history on file.    Social       .     Allergies   Allergen Reactions    Sulfa Antibiotics Hives       Home Medications    None on File          Review of Systems   Gastrointestinal:  Positive for abdominal pain and vomiting.   Musculoskeletal:  Positive for back pain.   All other systems reviewed and are negative.      Physical Exam    BP: (!) 118/94, Heart Rate: 63, Temp: 97.8 F (36.6 C), Resp Rate: 20, SpO2: 100 %, Weight: 69.3 kg    Physical Exam  Vitals and nursing note reviewed.   Constitutional:       General: She is not in acute distress.     Appearance: She is well-developed and normal weight. She is not ill-appearing, toxic-appearing or diaphoretic.   HENT:      Head: Normocephalic and atraumatic.      Mouth/Throat:      Mouth: Mucous membranes are moist.      Pharynx: Oropharynx is clear.   Cardiovascular:      Rate and Rhythm: Normal rate and regular rhythm.      Heart sounds: Normal heart sounds.   Pulmonary:      Effort: Pulmonary effort is normal. No respiratory distress.      Breath sounds: Normal breath sounds. No stridor. No wheezing, rhonchi or rales.   Chest:      Chest wall: No tenderness.   Abdominal:      General: Abdomen is flat. Bowel  sounds are normal. There is no distension.      Palpations: Abdomen is soft. There is no mass.      Tenderness: There is abdominal tenderness in the epigastric area. There is no guarding.   Skin:     General: Skin is warm.   Neurological:      General: No focal deficit present.      Mental Status: She is alert.           MDM and ED Course     ED Medication Orders (From admission, onward)      Start Ordered     Status Ordering Provider    03/22/22 0315 03/22/22 0316    Every 6 hours PRN        Route: Other  Ordered Dose: 3 tablet     Discontinued Zondra Lawlor M    03/22/22 0249 03/22/22 0248  ketorolac (TORADOL) IM injection 60 mg  Once in ED  Route: Intramuscular  Ordered Dose: 60 mg     Last MAR action: Given Carleene Mains    03/22/22 0249 03/22/22 0248  predniSONE (DELTASONE) tablet 40 mg  Once in ED        Route: Oral  Ordered Dose: 40 mg     Last MAR action: Given Carleene Mains    03/22/22 0249 03/22/22 0248  cyclobenzaprine (FLEXERIL) tablet 10 mg  Once in ED        Route: Oral  Ordered Dose: 10 mg     Last MAR action: Given Lexx [place], [place] Decision Making  Risk  Prescription drug management.    36 year old female was brought to emergency room complaining of mid back and epigastric area pain.    Differential diagnosis includes but not limited to muscle spasm, herniated disc, fracture vertebrae, leaking aneurysm, perforated ulcer, peptic ulcer disease, gallbladder disease, pancreatitis, intestinal obstruction, kidney stone.    Patient started the symptoms yesterday afternoon, condition got worse around 1 AM therefore patient was brought to emergency room.  Examination showed that patient has tenderness epigastric area and spasm of paravertebral muscles bilaterally.  Patient received Toradol, Flexeril, and prednisone in the emergency department.  Patient was advised to have blood work, CT and urine exam, patient refused.  Patient was advised to take hydrocodone 1 tablet every 6  hours if needed for pain, Flexeril 10 mg 1 tablet twice daily, prednisone 20 mg 1 tablet/day for 5 days.  Return to emergency room if condition did not improve.    Answered patient and patient's husband's questions, they understood my explanation and advice.                 Procedures    Clinical Impression & Disposition     Clinical Impression  Final diagnoses:   Acute bilateral back pain, unspecified back location   Epigastric pain        ED Disposition       ED Disposition   Discharge    Condition   --    Date/Time   Sun Mar 22, 2022  3:24 AM    Comment   Cabrini D Bon discharge to home/self care.    Condition at disposition: Stable                  Discharge Medication List as of 03/22/2022  3:25 AM        START taking these medications    Details   cyclobenzaprine (FLEXERIL) 10 MG tablet Take 1 tablet (10 mg) by mouth 2 (two) times daily as needed for Muscle spasms, Starting Sun 03/22/2022, E-Rx      HYDROcodone-acetaminophen (NORCO) 5-325 MG per tablet Take 1 tablet by mouth every 6 (six) hours as needed for Pain, Starting Sun 03/22/2022, E-Rx      ondansetron (ZOFRAN) 4 MG tablet Take 1 tablet (4 mg) by mouth every 6 (six) hours as needed for Nausea, Starting Sun 03/22/2022, E-Rx      predniSONE (DELTASONE) 20 MG tablet Take 1 tablet (20 mg) by mouth daily, Starting Sun 03/22/2022, E-Rx                         Jonathan Corpus, Ambrose Pancoast, MD  03/22/22 1209

## 2022-03-22 NOTE — Discharge Instructions (Addendum)
Rest, hydrocodone 1 tablet every 6 hours if needed for pain, Flexeril 10 mg 1 tablet twice a day, prednisone 20 mg 1 tablet/day for 5 days, see family physician for follow-up, return to emergency room if needed.

## 2022-10-08 LAB — OB RESULTS CONSOLE HIV ANTIBODY (ROUTINE TESTING): HIV: NONREACTIVE

## 2022-10-08 LAB — OB RESULTS CONSOLE RPR: RPR: NONREACTIVE

## 2022-10-08 LAB — OB RESULTS CONSOLE ABO/RH: RH Type: POSITIVE

## 2022-10-08 LAB — OB RESULTS CONSOLE RUBELLA ANTIBODY, IGM: Rubella: NON-IMMUNE/NOT IMMUNE

## 2022-10-08 LAB — OB RESULTS CONSOLE HEPATITIS B SURFACE ANTIGEN: Hepatitis B Surface Ag: NEGATIVE

## 2022-10-08 LAB — OB RESULTS CONSOLE ANTIBODY SCREEN: Antibody Screen: NEGATIVE

## 2022-10-08 LAB — OB RESULTS CONSOLE GC/CHLAMYDIA
Chlamydia: NEGATIVE
Neisseria Gonorrhea: NEGATIVE

## 2022-10-08 LAB — HEPATITIS C ANTIBODY: HCV Ab: NEGATIVE

## 2022-12-14 NOTE — L&D Delivery Note (Signed)
Delivery Note Pt labored quickly to complete following comfort with epidural.  She pushed three times and at 5:45 PM a viable female was delivered via Vaginal, Spontaneous (Presentation: Left Occiput Anterior).  APGAR: 8, 9; weight  pending . Nuchal x 1 reduced over infants head easily. Anterior shoulder delivered easily and posterior shoulder delivered in McRoberts position with slight rotation.  Bulb suction performed  Cord clamped and cut after a minute delay while on mothers' abdomen.  Placenta status: Spontaneous, Intact.  Tomasa Blase with small missing piece retrieved on manual swipe. Fundus firm Cord: 3 vessels with the following complications: None.  Cord pH: noted   Anesthesia: Epidural Episiotomy: None Lacerations: 1st degree;Perineal Suture Repair:  4-0 vicryl - one stitch  Est. Blood Loss (mL): 220  Mom to postpartum.  Baby to Couplet care / Skin to Skin.  Cathrine Muster 05/06/2023, 6:03 PM

## 2023-02-24 ENCOUNTER — Ambulatory Visit: Payer: 59

## 2023-04-21 LAB — OB RESULTS CONSOLE GBS: GBS: NEGATIVE

## 2023-04-24 ENCOUNTER — Inpatient Hospital Stay (HOSPITAL_COMMUNITY)
Admission: AD | Admit: 2023-04-24 | Discharge: 2023-04-24 | Disposition: A | Discharge status: Home / Self Care | Payer: 59 | Attending: Obstetrics and Gynecology | Admitting: Obstetrics and Gynecology

## 2023-04-24 ENCOUNTER — Encounter (HOSPITAL_COMMUNITY): Payer: Self-pay | Admitting: *Deleted

## 2023-04-24 DIAGNOSIS — O26893 Other specified pregnancy related conditions, third trimester: Secondary | ICD-10-CM | POA: Diagnosis not present

## 2023-04-24 DIAGNOSIS — O09523 Supervision of elderly multigravida, third trimester: Secondary | ICD-10-CM | POA: Insufficient documentation

## 2023-04-24 DIAGNOSIS — L299 Pruritus, unspecified: Secondary | ICD-10-CM | POA: Insufficient documentation

## 2023-04-24 DIAGNOSIS — Z3A37 37 weeks gestation of pregnancy: Secondary | ICD-10-CM | POA: Diagnosis not present

## 2023-04-24 LAB — CBC
HCT: 29.2 % — ABNORMAL LOW (ref 36.0–46.0)
Hemoglobin: 8.9 g/dL — ABNORMAL LOW (ref 12.0–15.0)
MCH: 23.1 pg — ABNORMAL LOW (ref 26.0–34.0)
MCHC: 30.5 g/dL (ref 30.0–36.0)
MCV: 75.8 fL — ABNORMAL LOW (ref 80.0–100.0)
Platelets: 179 10*3/uL (ref 150–400)
RBC: 3.85 MIL/uL — ABNORMAL LOW (ref 3.87–5.11)
RDW: 15.1 % (ref 11.5–15.5)
WBC: 7.8 10*3/uL (ref 4.0–10.5)
nRBC: 0 % (ref 0.0–0.2)

## 2023-04-24 LAB — COMPREHENSIVE METABOLIC PANEL
ALT: 14 U/L (ref 0–44)
AST: 19 U/L (ref 15–41)
Albumin: 2.5 g/dL — ABNORMAL LOW (ref 3.5–5.0)
Alkaline Phosphatase: 199 U/L — ABNORMAL HIGH (ref 38–126)
Anion gap: 10 (ref 5–15)
BUN: 7 mg/dL (ref 6–20)
CO2: 18 mmol/L — ABNORMAL LOW (ref 22–32)
Calcium: 8.8 mg/dL — ABNORMAL LOW (ref 8.9–10.3)
Chloride: 105 mmol/L (ref 98–111)
Creatinine, Ser: 0.54 mg/dL (ref 0.44–1.00)
GFR, Estimated: >60 mL/min (ref >60)
Glucose, Bld: 101 mg/dL — ABNORMAL HIGH (ref 70–99)
Potassium: 3.6 mmol/L (ref 3.5–5.1)
Sodium: 133 mmol/L — ABNORMAL LOW (ref 135–145)
Total Bilirubin: 0.5 mg/dL (ref 0.3–1.2)
Total Protein: 6.1 g/dL — ABNORMAL LOW (ref 6.5–8.1)

## 2023-04-24 NOTE — MAU Provider Note (Signed)
History     CSN: [id number]  Arrival date and time: 04/24/23 6644   Event Date/Time   First Provider Initiated Contact with Patient 04/24/23 [phone]      Chief Complaint  Patient presents with   Pruritis   HPI  Diane Jackson is a 37 y.o. G2P1001 at [redacted]w[redacted]d who presents for evaluation of generalized body itching. Patient reports she started itching all over on Friday morning. She states she has not tried any new foods or soaps/lotions. She reports she tried leaving her home in case the allergen was there with no relief. She took a shower and tried aloe vera with no relief. She denies any rash or skin changes. She has not tried any medication for the itching. She was sent to MAU for lab work.   She denies any pain. She denies any vaginal bleeding, discharge, and leaking of fluid. Denies any constipation, diarrhea or any urinary complaints. Reports normal fetal movement.   OB History     Gravida  2   Para  1   Term  1   Preterm      AB      Living  1      SAB      IAB      Ectopic      Multiple  0   Live Births  1           Past Medical History:  Diagnosis Date   SVD (spontaneous vaginal delivery) 09/17/2018   Vaginal Pap smear, abnormal     Past Surgical History:  Procedure Laterality Date   CERVICAL BIOPSY     WISDOM TOOTH EXTRACTION      History reviewed. No pertinent family history.  Social History   Tobacco Use   Smoking status: Never   Smokeless tobacco: Never  Substance Use Topics   Alcohol use: Not Currently   Drug use: Not Currently    Allergies:  Allergies  Allergen Reactions   Sulfa Antibiotics Swelling    Facial swelling    Medications Prior to Admission  Medication Sig Dispense Refill Last Dose   multivitamin-iron-minerals-folic acid (CENTRUM) chewable tablet Chew 1 tablet by mouth daily. 30 tablet 1 04/24/2023   omeprazole (PRILOSEC) 20 MG capsule Take 20 mg by mouth daily.   04/23/2023   acetaminophen (TYLENOL)  500 MG tablet Take 500 mg by mouth as needed for moderate pain.   04/19/2023   ibuprofen (ADVIL,MOTRIN) 600 MG tablet Take 1 tablet (600 mg total) by mouth every 6 (six) hours as needed for mild pain, moderate pain or cramping. 40 tablet 1    labetalol (NORMODYNE) 200 MG tablet Take 1 tablet (200 mg total) by mouth 2 (two) times daily. 60 tablet 2 never took    Review of Systems  Constitutional: Negative.  Negative for fatigue and fever.  HENT: Negative.    Respiratory: Negative.  Negative for shortness of breath.   Cardiovascular: Negative.  Negative for chest pain.  Gastrointestinal: Negative.  Negative for abdominal pain, constipation, diarrhea, nausea and vomiting.  Genitourinary: Negative.  Negative for dysuria.  Skin:        itching  Neurological: Negative.  Negative for dizziness and headaches.   Physical Exam   Blood pressure 126/75, pulse 72, temperature 98 F (36.7 C), temperature source Oral, resp. rate 12, height 5\' 4"  (1.626 m), weight 74.7 kg, unknown if currently breastfeeding.  Patient Vitals for the past 24 hrs:  BP Temp Temp src Pulse Resp Height Weight  04/24/23 0410 126/75 -- -- 72 -- -- --  04/24/23 0348 122/76 98 F (36.7 C) Oral 78 12 5\' 4"  (1.626 m) 74.7 kg    Physical Exam Vitals and nursing note reviewed.  Constitutional:      General: She is not in acute distress.    Appearance: She is well-developed.  HENT:     Head: Normocephalic.  Eyes:     Pupils: Pupils are equal, round, and reactive to light.  Cardiovascular:     Rate and Rhythm: Normal rate and regular rhythm.     Heart sounds: Normal heart sounds.  Pulmonary:     Effort: Pulmonary effort is normal. No respiratory distress.     Breath sounds: Normal breath sounds.  Abdominal:     General: Bowel sounds are normal. There is no distension.     Palpations: Abdomen is soft.     Tenderness: There is no abdominal tenderness.  Skin:    General: Skin is warm and dry.  Neurological:     Mental  Status: She is alert and oriented to person, place, and time.  Psychiatric:        Mood and Affect: Mood normal.        Behavior: Behavior normal.        Thought Content: Thought content normal.        Judgment: Judgment normal.     Fetal Tracing:  Baseline: 120 Variability: moderate Accels: 15x15 Decels: none  Toco: UI      MAU Course  Procedures  Results for orders placed or performed during the hospital encounter of 04/24/23 (from the past 24 hour(s))  CBC     Status: Abnormal   Collection Time: 04/24/23  4:41 AM  Result Value Ref Range   WBC 7.8 4.0 - 10.5 K/uL   RBC 3.85 (L) 3.87 - 5.11 MIL/uL   Hemoglobin 8.9 (L) 12.0 - 15.0 g/dL   HCT 16.1 (L) 09.6 - 04.5 %   MCV 75.8 (L) 80.0 - 100.0 fL   MCH 23.1 (L) 26.0 - 34.0 pg   MCHC 30.5 30.0 - 36.0 g/dL   RDW 40.9 81.1 - 91.4 %   Platelets 179 150 - 400 K/uL   nRBC 0.0 0.0 - 0.2 %  Comprehensive metabolic panel     Status: Abnormal   Collection Time: 04/24/23  4:41 AM  Result Value Ref Range   Sodium 133 (L) 135 - 145 mmol/L   Potassium 3.6 3.5 - 5.1 mmol/L   Chloride 105 98 - 111 mmol/L   CO2 18 (L) 22 - 32 mmol/L   Glucose, Bld 101 (H) 70 - 99 mg/dL   BUN 7 6 - 20 mg/dL   Creatinine, Ser 7.82 0.44 - 1.00 mg/dL   Calcium 8.8 (L) 8.9 - 10.3 mg/dL   Total Protein 6.1 (L) 6.5 - 8.1 g/dL   Albumin 2.5 (L) 3.5 - 5.0 g/dL   AST 19 15 - 41 U/L   ALT 14 0 - 44 U/L   Alkaline Phosphatase 199 (H) 38 - 126 U/L   Total Bilirubin 0.5 0.3 - 1.2 mg/dL   GFR, Estimated >95 >62 mL/min   Anion gap 10 5 - 15    MDM Prenatal records from community office reviewed. Pregnancy complicated by AMA. 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   CBC, CMP Bile Acids  Offered medication for itching and patient declines  Discussed that bile acids can take up to 4 days to come back and patient  will be notified when results received. OTC options for itching relief reviewed  Assessment and Plan   1. Pruritus   2. [redacted] weeks  gestation of pregnancy     -Discharge home in stable condition -Third trimester precautions discussed -Patient advised to follow-up with OB as scheduled for prenatal care -Patient may return to MAU as needed or if her condition were to change or worsen  Rolm Bookbinder, CNM 04/24/2023, 4:19 AM

## 2023-04-24 NOTE — Discharge Instructions (Signed)

## 2023-04-24 NOTE — MAU Note (Addendum)
Pt says her skin itches-all over -  started Friday am  [facility]- called Dr Mindi Slicker - told to come in [facility] a few UC's  VE in office 1 cm GDM

## 2023-04-25 LAB — BILE ACIDS, TOTAL: Bile Acids Total: 3.8 umol/L (ref 0.0–10.0)

## 2023-04-26 ENCOUNTER — Telehealth (HOSPITAL_COMMUNITY): Payer: Self-pay | Admitting: *Deleted

## 2023-04-26 ENCOUNTER — Inpatient Hospital Stay (HOSPITAL_COMMUNITY)
Admission: AD | Admit: 2023-04-26 | Discharge: 2023-04-26 | Disposition: A | Discharge status: Home / Self Care | Payer: 59 | Attending: Obstetrics and Gynecology | Admitting: Obstetrics and Gynecology

## 2023-04-26 DIAGNOSIS — Z3A37 37 weeks gestation of pregnancy: Secondary | ICD-10-CM | POA: Diagnosis not present

## 2023-04-26 DIAGNOSIS — N888 Other specified noninflammatory disorders of cervix uteri: Secondary | ICD-10-CM | POA: Diagnosis not present

## 2023-04-26 DIAGNOSIS — O471 False labor at or after 37 completed weeks of gestation: Secondary | ICD-10-CM | POA: Diagnosis present

## 2023-04-26 NOTE — MAU Note (Signed)
.  Diane Jackson is a 37 y.o. at [redacted]w[redacted]d here in MAU reporting: lost mucus plug Saturday, and yesterday started having intermittent irregular ABD cramping, and about 0100 pt went to the bathroom and show bright red VB in the toilet and with wiping. Pt called OB on call and was told to be evaluated. PT endorses FM. Pt wearing a pad and had scant amount of VB. Pt denies LOF, abnormal discharge, recent intercourse. GDM on insulin  Hx of PreE and Post Ecclampsia   Onset of complaint: 0100 Pain score: 0/10 Vitals:   04/26/23 0224  BP: 122/76  Pulse: 75  Resp: 16  Temp: 98.1 F (36.7 C)  SpO2: 100%     FHT:130 Lab orders placed from triage:

## 2023-04-26 NOTE — Telephone Encounter (Signed)
Preadmission screen  

## 2023-04-26 NOTE — Discharge Instructions (Signed)

## 2023-04-26 NOTE — MAU Provider Note (Signed)
History     CSN: [id number]  Arrival date and time: 04/26/23 0141   Event Date/Time   First Provider Initiated Contact with Patient 04/26/23 0236      Chief Complaint  Patient presents with   Vaginal Bleeding   HPI  Diane Jackson is a 37 y.o. G2P1001 at [redacted]w[redacted]d who presents for evaluation of vaginal bleeding. Patient reports she got up to go to the bathroom at 0100 and had bright red blood on the tissue when she wiped. She denies any bleeding now. She reports she was having some contractions yesterday but none now. Denies any pain.  She denies any discharge and leaking of fluid. Denies any constipation, diarrhea or any urinary complaints. Reports normal fetal movement.   OB History     Gravida  2   Para  1   Term  1   Preterm      AB      Living  1      SAB      IAB      Ectopic      Multiple  0   Live Births  1           Past Medical History:  Diagnosis Date   SVD (spontaneous vaginal delivery) 09/17/2018   Vaginal Pap smear, abnormal     Past Surgical History:  Procedure Laterality Date   CERVICAL BIOPSY     WISDOM TOOTH EXTRACTION      No family history on file.  Social History   Tobacco Use   Smoking status: Never   Smokeless tobacco: Never  Substance Use Topics   Alcohol use: Not Currently   Drug use: Not Currently    Allergies:  Allergies  Allergen Reactions   Sulfa Antibiotics Swelling    Facial swelling    Medications Prior to Admission  Medication Sig Dispense Refill Last Dose   acetaminophen (TYLENOL) 500 MG tablet Take 500 mg by mouth as needed for moderate pain.      ibuprofen (ADVIL,MOTRIN) 600 MG tablet Take 1 tablet (600 mg total) by mouth every 6 (six) hours as needed for mild pain, moderate pain or cramping. 40 tablet 1    labetalol (NORMODYNE) 200 MG tablet Take 1 tablet (200 mg total) by mouth 2 (two) times daily. 60 tablet 2    multivitamin-iron-minerals-folic acid (CENTRUM) chewable tablet Chew 1  tablet by mouth daily. 30 tablet 1    omeprazole (PRILOSEC) 20 MG capsule Take 20 mg by mouth daily.       Review of Systems  Constitutional: Negative.  Negative for fatigue and fever.  HENT: Negative.    Respiratory: Negative.  Negative for shortness of breath.   Cardiovascular: Negative.  Negative for chest pain.  Gastrointestinal: Negative.  Negative for abdominal pain, constipation, diarrhea, nausea and vomiting.  Genitourinary:  Positive for vaginal bleeding. Negative for dysuria and vaginal discharge.  Neurological: Negative.  Negative for dizziness and headaches.   Physical Exam   Blood pressure 122/76, pulse 78, temperature 98.1 F (36.7 C), temperature source Oral, resp. rate 16, height 5\' 4"  (1.626 m), weight 74.6 kg, SpO2 100 %, unknown if currently breastfeeding.  Patient Vitals for the past 24 hrs:  BP Temp Temp src Pulse Resp SpO2 Height Weight  04/26/23 0226 122/76 -- -- 78 -- -- -- --  04/26/23 0224 122/76 98.1 F (36.7 C) Oral 75 16 100 % 5\' 4"  (1.626 m) 74.6 kg    Physical Exam Vitals and nursing note  reviewed.  Constitutional:      General: She is not in acute distress.    Appearance: She is well-developed.  HENT:     Head: Normocephalic.  Eyes:     Pupils: Pupils are equal, round, and reactive to light.  Cardiovascular:     Rate and Rhythm: Normal rate and regular rhythm.     Heart sounds: Normal heart sounds.  Pulmonary:     Effort: Pulmonary effort is normal. No respiratory distress.     Breath sounds: Normal breath sounds.  Abdominal:     General: Bowel sounds are normal. There is no distension.     Palpations: Abdomen is soft.     Tenderness: There is no abdominal tenderness.  Genitourinary:    Comments: Pelvic exam: Cervix pink, friability noted, scant white creamy discharge, vaginal walls and external genitalia normal  Skin:    General: Skin is warm and dry.  Neurological:     Mental Status: She is alert and oriented to person, place, and  time.  Psychiatric:        Mood and Affect: Mood normal.        Behavior: Behavior normal.        Thought Content: Thought content normal.        Judgment: Judgment normal.     Fetal Tracing:  Baseline: 120 Variability: moderate Accels: 15x15 Decels: none  Toco: occasional uc's  Cervix: 1.5/thick/posterior  MAU Course  Procedures  MDM Labs ordered and reviewed.   B Pos No bleeding noted on exam  Assessment and Plan   1. Friable cervix   2. [redacted] weeks gestation of pregnancy   3. False labor after 37 completed weeks of gestation    -Discharge home in stable condition -Third trimester precautions discussed -Patient advised to follow-up with OB as scheduled for prenatal care -Patient may return to MAU as needed or if her condition were to change or worsen  Rolm Bookbinder, CNM 04/26/2023, 2:37 AM

## 2023-04-28 ENCOUNTER — Encounter (HOSPITAL_COMMUNITY): Payer: Self-pay

## 2023-04-30 ENCOUNTER — Telehealth (HOSPITAL_COMMUNITY): Payer: Self-pay | Admitting: *Deleted

## 2023-04-30 NOTE — Telephone Encounter (Signed)
Preadmission screen  

## 2023-05-03 ENCOUNTER — Encounter (HOSPITAL_COMMUNITY): Payer: Self-pay | Admitting: *Deleted

## 2023-05-03 ENCOUNTER — Telehealth (HOSPITAL_COMMUNITY): Payer: Self-pay | Admitting: *Deleted

## 2023-05-03 NOTE — Telephone Encounter (Signed)
Preadmission screenPreadmission screen 

## 2023-05-04 ENCOUNTER — Non-Acute Institutional Stay (HOSPITAL_COMMUNITY)
Admission: RE | Admit: 2023-05-04 | Discharge: 2023-05-04 | Disposition: A | Discharge status: Home / Self Care | Payer: 59 | Source: Ambulatory Visit | Attending: Internal Medicine | Admitting: Internal Medicine

## 2023-05-04 DIAGNOSIS — O99019 Anemia complicating pregnancy, unspecified trimester: Secondary | ICD-10-CM | POA: Insufficient documentation

## 2023-05-04 MED ORDER — SODIUM CHLORIDE 0.9 % IV SOLN
510.0000 mg | Freq: Once | INTRAVENOUS | Status: AC
  Administered 2023-05-04: 510 mg via INTRAVENOUS
  Filled 2023-05-04: qty 17

## 2023-05-04 MED ORDER — SODIUM CHLORIDE 0.9 % IV SOLN: INTRAVENOUS | Status: DC | PRN

## 2023-05-04 NOTE — Progress Notes (Signed)
PATIENT CARE CENTER NOTE  Diagnosis: ICD-10:099.019: Anemia complicating pregnancy, unspecified trimester   Provider: Dr. Pryor Ochoa  Procedure: Feraheme 510 mg  Note: Patient received Feraheme 510 mg (dose #1 of 1). Pt tolerated infusion with no adverse reaction. Pt observed for 30 minutes post infusion. AVS printed and given to pt. Pt is alert, oriented, and ambulatory at discharge. Pt discharged home with spouse.

## 2023-05-06 ENCOUNTER — Inpatient Hospital Stay (HOSPITAL_COMMUNITY): Payer: 59 | Admitting: Anesthesiology

## 2023-05-06 ENCOUNTER — Encounter (HOSPITAL_COMMUNITY): Payer: Self-pay | Admitting: Obstetrics and Gynecology

## 2023-05-06 ENCOUNTER — Inpatient Hospital Stay (HOSPITAL_COMMUNITY): Payer: 59

## 2023-05-06 ENCOUNTER — Inpatient Hospital Stay (HOSPITAL_COMMUNITY)
Admission: AD | Admit: 2023-05-06 | Discharge: 2023-05-08 | DRG: 807 | Disposition: A | Discharge status: Home / Self Care | Payer: 59 | Attending: Obstetrics and Gynecology | Admitting: Obstetrics and Gynecology

## 2023-05-06 DIAGNOSIS — O24424 Gestational diabetes mellitus in childbirth, insulin controlled: Principal | ICD-10-CM | POA: Diagnosis present

## 2023-05-06 DIAGNOSIS — Z23 Encounter for immunization: Secondary | ICD-10-CM | POA: Diagnosis not present

## 2023-05-06 DIAGNOSIS — Z3A39 39 weeks gestation of pregnancy: Secondary | ICD-10-CM

## 2023-05-06 DIAGNOSIS — Z3490 Encounter for supervision of normal pregnancy, unspecified, unspecified trimester: Principal | ICD-10-CM

## 2023-05-06 DIAGNOSIS — O99019 Anemia complicating pregnancy, unspecified trimester: Secondary | ICD-10-CM | POA: Diagnosis present

## 2023-05-06 DIAGNOSIS — O9962 Diseases of the digestive system complicating childbirth: Secondary | ICD-10-CM | POA: Diagnosis present

## 2023-05-06 DIAGNOSIS — K219 Gastro-esophageal reflux disease without esophagitis: Secondary | ICD-10-CM | POA: Diagnosis present

## 2023-05-06 DIAGNOSIS — O26893 Other specified pregnancy related conditions, third trimester: Secondary | ICD-10-CM | POA: Diagnosis present

## 2023-05-06 LAB — TYPE AND SCREEN
ABO/RH(D): B POS
Antibody Screen: NEGATIVE

## 2023-05-06 LAB — COMPREHENSIVE METABOLIC PANEL
ALT: 14 U/L (ref 0–44)
AST: 23 U/L (ref 15–41)
Albumin: 2.9 g/dL — ABNORMAL LOW (ref 3.5–5.0)
Alkaline Phosphatase: 226 U/L — ABNORMAL HIGH (ref 38–126)
Anion gap: 11 (ref 5–15)
BUN: 8 mg/dL (ref 6–20)
CO2: 17 mmol/L — ABNORMAL LOW (ref 22–32)
Calcium: 9.3 mg/dL (ref 8.9–10.3)
Chloride: 106 mmol/L (ref 98–111)
Creatinine, Ser: 0.66 mg/dL (ref 0.44–1.00)
GFR, Estimated: >60 mL/min (ref >60)
Glucose, Bld: 114 mg/dL — ABNORMAL HIGH (ref 70–99)
Potassium: 3.6 mmol/L (ref 3.5–5.1)
Sodium: 134 mmol/L — ABNORMAL LOW (ref 135–145)
Total Bilirubin: 0.4 mg/dL (ref 0.3–1.2)
Total Protein: 7.2 g/dL (ref 6.5–8.1)

## 2023-05-06 LAB — CBC
HCT: 36.5 % (ref 36.0–46.0)
Hemoglobin: 11.2 g/dL — ABNORMAL LOW (ref 12.0–15.0)
MCH: 23.5 pg — ABNORMAL LOW (ref 26.0–34.0)
MCHC: 30.7 g/dL (ref 30.0–36.0)
MCV: 76.5 fL — ABNORMAL LOW (ref 80.0–100.0)
Platelets: 189 10*3/uL (ref 150–400)
RBC: 4.77 MIL/uL (ref 3.87–5.11)
RDW: 17.2 % — ABNORMAL HIGH (ref 11.5–15.5)
WBC: 9.8 10*3/uL (ref 4.0–10.5)
nRBC: 0.2 % (ref 0.0–0.2)

## 2023-05-06 LAB — PROTEIN / CREATININE RATIO, URINE
Creatinine, Urine: 75 mg/dL
Protein Creatinine Ratio: 0.11 mg/mg{Cre} (ref 0.00–0.15)
Total Protein, Urine: 8 mg/dL

## 2023-05-06 LAB — RPR: RPR Ser Ql: NONREACTIVE

## 2023-05-06 MED ORDER — OXYCODONE HCL 5 MG PO TABS
5.0000 mg | ORAL_TABLET | ORAL | Status: DC | PRN
  Administered 2023-05-07 (×3): 5 mg via ORAL
  Filled 2023-05-06 (×2): qty 1

## 2023-05-06 MED ORDER — ONDANSETRON HCL 4 MG/2ML IJ SOLN: 4.0000 mg | Freq: Four times a day (QID) | INTRAMUSCULAR | Status: DC | PRN

## 2023-05-06 MED ORDER — OXYCODONE-ACETAMINOPHEN 5-325 MG PO TABS: 1.0000 | ORAL_TABLET | ORAL | Status: DC | PRN

## 2023-05-06 MED ORDER — ACETAMINOPHEN 325 MG PO TABS
650.0000 mg | ORAL_TABLET | ORAL | Status: DC | PRN
  Administered 2023-05-07 (×2): 650 mg via ORAL
  Filled 2023-05-06 (×2): qty 2

## 2023-05-06 MED ORDER — DIPHENHYDRAMINE HCL 50 MG/ML IJ SOLN: 12.5000 mg | INTRAMUSCULAR | Status: DC | PRN

## 2023-05-06 MED ORDER — LACTATED RINGERS IV SOLN: 500.0000 mL | Freq: Once | INTRAVENOUS | Status: DC

## 2023-05-06 MED ORDER — LACTATED RINGERS IV SOLN: INTRAVENOUS | Status: DC

## 2023-05-06 MED ORDER — FENTANYL-BUPIVACAINE-NACL 0.5-0.125-0.9 MG/250ML-% EP SOLN
12.0000 mL/h | EPIDURAL | Status: DC | PRN
  Administered 2023-05-06: 12 mL/h via EPIDURAL
  Filled 2023-05-06: qty 250

## 2023-05-06 MED ORDER — OXYTOCIN BOLUS FROM INFUSION
333.0000 mL | Freq: Once | INTRAVENOUS | Status: AC
  Administered 2023-05-06: 333 mL via INTRAVENOUS

## 2023-05-06 MED ORDER — PHENYLEPHRINE 80 MCG/ML (10ML) SYRINGE FOR IV PUSH (FOR BLOOD PRESSURE SUPPORT)
80.0000 ug | PREFILLED_SYRINGE | INTRAVENOUS | Status: DC | PRN
  Filled 2023-05-06: qty 10

## 2023-05-06 MED ORDER — EPHEDRINE 5 MG/ML INJ: 10.0000 mg | INTRAVENOUS | Status: DC | PRN

## 2023-05-06 MED ORDER — TERBUTALINE SULFATE 1 MG/ML IJ SOLN: 0.2500 mg | Freq: Once | INTRAMUSCULAR | Status: DC | PRN

## 2023-05-06 MED ORDER — PHENYLEPHRINE 80 MCG/ML (10ML) SYRINGE FOR IV PUSH (FOR BLOOD PRESSURE SUPPORT): 80.0000 ug | PREFILLED_SYRINGE | INTRAVENOUS | Status: DC | PRN

## 2023-05-06 MED ORDER — ONDANSETRON HCL 4 MG PO TABS: 4.0000 mg | ORAL_TABLET | ORAL | Status: DC | PRN

## 2023-05-06 MED ORDER — COCONUT OIL OIL: 1.0000 | TOPICAL_OIL | Status: DC | PRN

## 2023-05-06 MED ORDER — SENNOSIDES-DOCUSATE SODIUM 8.6-50 MG PO TABS
2.0000 | ORAL_TABLET | Freq: Every day | ORAL | Status: DC
  Administered 2023-05-07 – 2023-05-08 (×2): 2 via ORAL
  Filled 2023-05-06 (×2): qty 2

## 2023-05-06 MED ORDER — LACTATED RINGERS IV SOLN: 500.0000 mL | INTRAVENOUS | Status: DC | PRN

## 2023-05-06 MED ORDER — ACETAMINOPHEN 325 MG PO TABS: 650.0000 mg | ORAL_TABLET | ORAL | Status: DC | PRN

## 2023-05-06 MED ORDER — DIPHENHYDRAMINE HCL 25 MG PO CAPS: 25.0000 mg | ORAL_CAPSULE | Freq: Four times a day (QID) | ORAL | Status: DC | PRN

## 2023-05-06 MED ORDER — OXYCODONE HCL 5 MG PO TABS: 10.0000 mg | ORAL_TABLET | ORAL | Status: DC | PRN

## 2023-05-06 MED ORDER — OXYCODONE-ACETAMINOPHEN 5-325 MG PO TABS: 2.0000 | ORAL_TABLET | ORAL | Status: DC | PRN

## 2023-05-06 MED ORDER — SIMETHICONE 80 MG PO CHEW: 80.0000 mg | CHEWABLE_TABLET | ORAL | Status: DC | PRN

## 2023-05-06 MED ORDER — BENZOCAINE-MENTHOL 20-0.5 % EX AERO
1.0000 | INHALATION_SPRAY | CUTANEOUS | Status: DC | PRN
  Administered 2023-05-07: 1 via TOPICAL
  Filled 2023-05-06: qty 56

## 2023-05-06 MED ORDER — OXYTOCIN-SODIUM CHLORIDE 30-0.9 UT/500ML-% IV SOLN: 2.5000 [IU]/h | INTRAVENOUS | Status: DC | PRN

## 2023-05-06 MED ORDER — DIBUCAINE (PERIANAL) 1 % EX OINT: 1.0000 | TOPICAL_OINTMENT | CUTANEOUS | Status: DC | PRN

## 2023-05-06 MED ORDER — OXYTOCIN-SODIUM CHLORIDE 30-0.9 UT/500ML-% IV SOLN
2.5000 [IU]/h | INTRAVENOUS | Status: DC
  Administered 2023-05-06: 2.5 [IU]/h via INTRAVENOUS

## 2023-05-06 MED ORDER — PRENATAL MULTIVITAMIN CH
1.0000 | ORAL_TABLET | Freq: Every day | ORAL | Status: DC
  Administered 2023-05-07 – 2023-05-08 (×2): 1 via ORAL
  Filled 2023-05-06 (×2): qty 1

## 2023-05-06 MED ORDER — FENTANYL CITRATE (PF) 100 MCG/2ML IJ SOLN
100.0000 ug | INTRAMUSCULAR | Status: DC | PRN
  Administered 2023-05-06 (×2): 100 ug via INTRAVENOUS
  Filled 2023-05-06 (×2): qty 2

## 2023-05-06 MED ORDER — LIDOCAINE HCL (PF) 1 % IJ SOLN
INTRAMUSCULAR | Status: DC | PRN
  Administered 2023-05-06: 2 mL via EPIDURAL
  Administered 2023-05-06: 5 mL via EPIDURAL
  Administered 2023-05-06: 3 mL via EPIDURAL

## 2023-05-06 MED ORDER — OXYTOCIN-SODIUM CHLORIDE 30-0.9 UT/500ML-% IV SOLN
1.0000 m[IU]/min | INTRAVENOUS | Status: DC
  Administered 2023-05-06: 2 m[IU]/min via INTRAVENOUS
  Administered 2023-05-06: 6 m[IU]/min via INTRAVENOUS
  Administered 2023-05-06: 4 m[IU]/min via INTRAVENOUS
  Filled 2023-05-06: qty 500

## 2023-05-06 MED ORDER — LIDOCAINE HCL (PF) 1 % IJ SOLN: 30.0000 mL | INTRAMUSCULAR | Status: DC | PRN

## 2023-05-06 MED ORDER — TETANUS-DIPHTH-ACELL PERTUSSIS 5-2.5-18.5 LF-MCG/0.5 IM SUSY: 0.5000 mL | PREFILLED_SYRINGE | Freq: Once | INTRAMUSCULAR | Status: DC

## 2023-05-06 MED ORDER — IBUPROFEN 600 MG PO TABS
600.0000 mg | ORAL_TABLET | Freq: Four times a day (QID) | ORAL | Status: DC
  Administered 2023-05-07 – 2023-05-08 (×5): 600 mg via ORAL
  Filled 2023-05-06 (×6): qty 1

## 2023-05-06 MED ORDER — WITCH HAZEL-GLYCERIN EX PADS: 1.0000 | MEDICATED_PAD | CUTANEOUS | Status: DC | PRN

## 2023-05-06 MED ORDER — ZOLPIDEM TARTRATE 5 MG PO TABS: 5.0000 mg | ORAL_TABLET | Freq: Every evening | ORAL | Status: DC | PRN

## 2023-05-06 MED ORDER — SOD CITRATE-CITRIC ACID 500-334 MG/5ML PO SOLN: 30.0000 mL | ORAL | Status: DC | PRN

## 2023-05-06 MED ORDER — ONDANSETRON HCL 4 MG/2ML IJ SOLN: 4.0000 mg | INTRAMUSCULAR | Status: DC | PRN

## 2023-05-06 NOTE — H&P (Signed)
Diane Jackson is a 37 y.o. female presenting for IOL at 72 0/7wks . Pt is dated per  7wk [place] Pregnancy has been complicated by : GDMA2 - declined metformin , preferred insulin , on 22u NPH History of preE- stable this pregnancy AMA - expected range NIPt GERD - on PPI Anemia of pregnancy  GBS neg.  OB History     Gravida  2   Para  1   Term  1   Preterm      AB      Living  1      SAB      IAB      Ectopic      Multiple  0   Live Births  1          Past Medical History:  Diagnosis Date   Anemia    Complication of anesthesia    epidural did not work very well   Gestational diabetes    History of pre-eclampsia in prior pregnancy, currently pregnant    SVD (spontaneous vaginal delivery) 09/17/2018   Vaginal Pap smear, abnormal    Past Surgical History:  Procedure Laterality Date   CERVICAL BIOPSY     WISDOM TOOTH EXTRACTION     Family History: family history includes Arthritis in her mother; Cancer in her father; Diabetes in her father, maternal grandfather, maternal grandmother, mother, paternal grandfather, and paternal grandmother; Hyperlipidemia in her father and mother; Hypertension in her father and mother; Kidney Stones in her mother. Social History:  reports that she has never smoked. She has never used smokeless tobacco. She reports that she does not currently use alcohol. She reports that she does not currently use drugs.     Maternal Diabetes: Yes:  Diabetes Type:  Insulin/Medication controlled Genetic Screening: Normal Maternal Ultrasounds/Referrals: Normal Fetal Ultrasounds or other Referrals:  None Maternal Substance Abuse:  No Significant Maternal Medications:  None Significant Maternal Lab Results:  Group B Strep negative Number of Prenatal Visits:greater than 3 verified prenatal visits Other Comments:  None  Review of Systems  Constitutional:  Positive for activity change and fatigue.  Eyes:  Negative for photophobia  and visual disturbance.  Respiratory:  Negative for chest tightness and shortness of breath.   Cardiovascular:  Positive for leg swelling. Negative for chest pain and palpitations.  Gastrointestinal:  Positive for abdominal pain.  Genitourinary:  Positive for pelvic pain.  Musculoskeletal:  Positive for back pain.  Neurological:  Negative for light-headedness and numbness.  Psychiatric/Behavioral:  The patient is nervous/anxious.    Maternal Medical History:  Reason for admission: IOL for GDMA2  Contractions: Onset was 3-5 hours ago.   Perceived severity is mild.   Fetal activity: Perceived fetal activity is normal.   Prenatal complications: no prenatal complications Prenatal Complications - Diabetes: gestational. Diabetes is managed by insulin injections.     Dilation: 4.5 Effacement (%): 70 Station: -3 Blood pressure 114/73, pulse 81, temperature 99.1 F (37.3 C), temperature source Oral, resp. rate 16, weight 73.9 kg, SpO2 100 %, unknown if currently breastfeeding. Maternal Exam:  Uterine Assessment: Contraction strength is mild.  Abdomen: Patient reports generalized tenderness.  Estimated fetal weight is AGA.   Fetal presentation: vertex Introitus: Normal vulva. Vulva is negative for condylomata.  Normal vagina.  Vagina is negative for condylomata.  Pelvis: adequate for delivery.   Cervix: Cervix evaluated by digital exam.     Fetal Exam Fetal Monitor Review: Baseline rate: 145.  Variability: moderate (6-25 bpm).  Pattern: accelerations present and no decelerations.   Fetal State Assessment: Category I - tracings are normal.   Physical Exam Vitals and nursing note reviewed. Exam conducted with a chaperone present.  Constitutional:      Appearance: Normal appearance. She is normal weight.  Cardiovascular:     Rate and Rhythm: Normal rate.  Pulmonary:     Effort: Pulmonary effort is normal.  Abdominal:     Tenderness: There is generalized abdominal tenderness.   Genitourinary:    General: Normal vulva.  Musculoskeletal:        General: Normal range of motion.     Cervical back: Normal range of motion.  Skin:    General: Skin is warm and dry.     Capillary Refill: Capillary refill takes 2 to 3 seconds.  Neurological:     General: No focal deficit present.     Mental Status: She is alert and oriented to person, place, and time. Mental status is at baseline.  Psychiatric:        Mood and Affect: Mood normal.        Behavior: Behavior normal.        Thought Content: Thought content normal.        Judgment: Judgment normal.     Prenatal labs: ABO, Rh: --/--/B POS (05/23 7829) Antibody: NEG (05/23 5621) Rubella: Nonimmune (10/26 0000) RPR: Nonreactive (10/26 0000)  HBsAg: Negative (10/26 0000)  HIV: Non-reactive (10/26 0000)  GBS: Negative/-- (05/08 0000)   Assessment/Plan: 37yo G3P1011 at 39 0/7wks for IOL - Admit  - Pitocin started - AROM now- clear fluid 4.5/80/-2 - Pain control prn  - GBS neg - Anticipate svd    Janean Sark Masai Kidd 05/06/2023, 9:31 AM

## 2023-05-06 NOTE — Anesthesia Procedure Notes (Signed)
Epidural Patient location during procedure: OB Start time: 05/06/2023 3:50 PM End time: 05/06/2023 3:57 PM  Staffing Anesthesiologist: Marcene Duos, MD Performed: anesthesiologist   Preanesthetic Checklist Completed: patient identified, IV checked, site marked, risks and benefits discussed, surgical consent, monitors and equipment checked, pre-op evaluation and timeout performed  Epidural Patient position: sitting Prep: DuraPrep and site prepped and draped Patient monitoring: continuous pulse ox and blood pressure Approach: midline Location: L4-L5 Injection technique: LOR air  Needle:  Needle type: Tuohy  Needle gauge: 17 G Needle length: 9 cm and 9 Needle insertion depth: 5 cm cm Catheter type: closed end flexible Catheter size: 19 Gauge Catheter at skin depth: 10 cm Test dose: negative  Assessment Events: blood not aspirated, no cerebrospinal fluid, injection not painful, no injection resistance, no paresthesia and negative IV test

## 2023-05-06 NOTE — Progress Notes (Signed)
Patient ID: Diane Jackson, female   DOB: 1986-07-20, 37 y.o.   MRN: [id number] Pt got epidural but not quite comfortable yet Plan to allow more time for it to work before checking her  VSS EFM -  150, +accels, - decels , mod variablity  TOCO - ctxs q 2-88mins   A/P: Expectant mgmt

## 2023-05-06 NOTE — Anesthesia Preprocedure Evaluation (Signed)
Anesthesia Evaluation  Patient identified by MRN, date of birth, ID band Patient awake    Reviewed: Allergy & Precautions, Patient's Chart, lab work & pertinent test results  Airway Mallampati: II  TM Distance: >3 FB     Dental   Pulmonary neg pulmonary ROS   Pulmonary exam normal        Cardiovascular Normal cardiovascular exam     Neuro/Psych negative neurological ROS     GI/Hepatic negative GI ROS, Neg liver ROS,,,  Endo/Other  diabetes, Gestational    Renal/GU negative Renal ROS     Musculoskeletal   Abdominal   Peds  Hematology negative hematology ROS (+)   Anesthesia Other Findings   Reproductive/Obstetrics (+) Pregnancy                             Anesthesia Physical Anesthesia Plan  ASA: 2  Anesthesia Plan: Epidural   Post-op Pain Management:    Induction:   PONV Risk Score and Plan: 2 and Treatment may vary due to age or medical condition  Airway Management Planned: Natural Airway  Additional Equipment:   Intra-op Plan:   Post-operative Plan:   Informed Consent: I have reviewed the patients History and Physical, chart, labs and discussed the procedure including the risks, benefits and alternatives for the proposed anesthesia with the patient or authorized representative who has indicated his/her understanding and acceptance.       Plan Discussed with:   Anesthesia Plan Comments:        Anesthesia Quick Evaluation

## 2023-05-07 LAB — CBC
HCT: 29.2 % — ABNORMAL LOW (ref 36.0–46.0)
Hemoglobin: 9.1 g/dL — ABNORMAL LOW (ref 12.0–15.0)
MCH: 24.1 pg — ABNORMAL LOW (ref 26.0–34.0)
MCHC: 31.2 g/dL (ref 30.0–36.0)
MCV: 77.2 fL — ABNORMAL LOW (ref 80.0–100.0)
Platelets: 161 10*3/uL (ref 150–400)
RBC: 3.78 MIL/uL — ABNORMAL LOW (ref 3.87–5.11)
RDW: 16.8 % — ABNORMAL HIGH (ref 11.5–15.5)
WBC: 14.6 10*3/uL — ABNORMAL HIGH (ref 4.0–10.5)
nRBC: 0.1 % (ref 0.0–0.2)

## 2023-05-07 MED ORDER — OXYCODONE HCL 5 MG PO TABS
5.0000 mg | ORAL_TABLET | Freq: Once | ORAL | Status: AC
  Filled 2023-05-07: qty 1

## 2023-05-07 NOTE — Anesthesia Postprocedure Evaluation (Signed)
Anesthesia Post Note  Patient: Diane Jackson  Procedure(s) Performed: AN AD HOC LABOR EPIDURAL     Patient location during evaluation: Mother Baby Anesthesia Type: Epidural Level of consciousness: awake and alert Pain management: pain level controlled Vital Signs Assessment: post-procedure vital signs reviewed and stable Respiratory status: spontaneous breathing, nonlabored ventilation and respiratory function stable Cardiovascular status: stable Postop Assessment: no headache, no backache, epidural receding, no apparent nausea or vomiting, patient able to bend at knees, able to ambulate and adequate PO intake Anesthetic complications: no   No notable events documented.  Last Vitals:  Vitals:   05/07/23 0500 05/07/23 0918  BP: 113/80 (!) 97/51  Pulse: 89 66  Resp: 17 16  Temp: 36.5 C 36.7 C  SpO2: 100%     Last Pain:  Vitals:   05/07/23 0918  TempSrc: Oral  PainSc: 0-No pain   Pain Goal: Patients Stated Pain Goal: 5 (05/06/23 1200)                 Oneta Sigman Hristova

## 2023-05-07 NOTE — Progress Notes (Addendum)
Post Partum Day 1 Subjective: Was called by RN this AM due to patient complaining of intermittent pelvic cramping despite ibuprofen/tylenol. Ordered oxycodone 5mg  at the time.  Patient sleeping. Husband states she has been having intermittent pelvic cramping, initially is relieved by pain meds and then returns. States she has been sleeping since receiving the oxycodone. Has been walking, voiding. Minimal lochia. Breastfeeding caused too much cramping pain so has switched to formula.   Objective: Patient Vitals for the past 24 hrs:  BP Temp Temp src Pulse Resp SpO2  05/07/23 0918 (!) 97/51 98.1 F (36.7 C) Oral 66 16 --  05/07/23 0500 113/80 97.7 F (36.5 C) Oral 89 17 100 %  05/07/23 0132 111/65 98.1 F (36.7 C) Oral 73 19 100 %  05/06/23 2123 101/71 99 F (37.2 C) Oral 92 18 100 %  05/06/23 2007 120/74 98.6 F (37 C) Oral 92 19 100 %  05/06/23 1846 119/63 -- -- 77 -- --  05/06/23 1831 131/77 -- -- 89 -- --  05/06/23 1817 (!) 120/96 -- -- 83 -- --  05/06/23 1801 (!) 134/47 -- -- 90 -- --  05/06/23 1758 127/75 -- -- 74 -- --  05/06/23 1701 117/71 -- -- 77 -- --  05/06/23 1646 107/70 -- -- (!) 126 -- --  05/06/23 1645 -- -- -- -- -- 100 %  05/06/23 1641 114/62 -- -- 87 -- --  05/06/23 1636 117/69 -- -- 79 -- --  05/06/23 1631 127/72 -- -- 92 -- --  05/06/23 1626 115/70 -- -- 85 -- --  05/06/23 1625 -- -- -- -- -- 100 %  05/06/23 1621 132/72 -- -- 79 -- --  05/06/23 1620 -- -- -- -- -- 100 %  05/06/23 1617 136/86 -- -- 78 -- --  05/06/23 1615 -- -- -- -- -- 100 %  05/06/23 1613 -- -- -- -- -- 100 %  05/06/23 1612 (!) 139/90 -- -- 94 -- 100 %  05/06/23 1607 -- -- -- -- -- 100 %  05/06/23 1606 (!) 148/84 -- -- 85 -- 100 %  05/06/23 1601 133/74 -- -- 98 -- 100 %  05/06/23 1559 (!) 147/80 -- -- 97 -- --  05/06/23 1555 -- -- -- -- -- 100 %  05/06/23 1550 -- -- -- -- -- 100 %  05/06/23 1547 116/89 -- -- 90 -- --  05/06/23 1514 93/78 -- -- 94 -- --  05/06/23 1457 130/72 -- -- 80 --  --  05/06/23 1412 131/78 -- -- 82 -- --  05/06/23 1324 118/72 -- -- 93 -- --  05/06/23 1250 135/74 98.9 F (37.2 C) Oral (!) 104 -- --  05/06/23 1210 -- -- -- -- -- 100 %  05/06/23 1154 118/70 -- -- 84 -- --  05/06/23 1123 103/69 -- -- 80 -- --    Physical Exam:  Sleeping  Recent Labs    05/06/23 0708 05/07/23 0614  WBC 9.8 14.6*  HGB 11.2* 9.1*  HCT 36.5 29.2*  PLT 189 161    Recent Labs    05/06/23 0708  NA 134*  K 3.6  CL 106  BUN 8  CREATININE 0.66  GLUCOSE 114*  BILITOT 0.4  ALT 14  AST 23  ALKPHOS 226*  PROT 7.2  ALBUMIN 2.9*    Recent Labs    05/06/23 0708  CALCIUM 9.3    No results for input(s): "PROTIME", "APTT", "INR" in the last 72 hours.  No results for input(s): "PROTIME", "APTT", "  INR", "FIBRINOGEN" in the last 72 hours. Assessment/Plan: Meryl D Logue 37 y.o. [id number] PPD#1 sp SVD 1. PPC: routine PP care 2.  Cramping: routine postpartum pain meds, oxycodone as needed 3. Rh pos 4. H/os postpartum eclampsia: has been normotensive postpartum. Requesting CBC/CMP tomorrow before discharge as abnormal LFTs was only abnormality prior to eclampsia last pregnancy.    LOS: 1 day   Charlett Nose 05/07/2023, 11:05 AM

## 2023-05-08 LAB — COMPREHENSIVE METABOLIC PANEL
ALT: 14 U/L (ref 0–44)
AST: 18 U/L (ref 15–41)
Albumin: 2.1 g/dL — ABNORMAL LOW (ref 3.5–5.0)
Alkaline Phosphatase: 132 U/L — ABNORMAL HIGH (ref 38–126)
Anion gap: 7 (ref 5–15)
BUN: 5 mg/dL — ABNORMAL LOW (ref 6–20)
CO2: 21 mmol/L — ABNORMAL LOW (ref 22–32)
Calcium: 8.5 mg/dL — ABNORMAL LOW (ref 8.9–10.3)
Chloride: 110 mmol/L (ref 98–111)
Creatinine, Ser: 0.63 mg/dL (ref 0.44–1.00)
GFR, Estimated: >60 mL/min (ref >60)
Glucose, Bld: 82 mg/dL (ref 70–99)
Potassium: 3.7 mmol/L (ref 3.5–5.1)
Sodium: 138 mmol/L (ref 135–145)
Total Bilirubin: 0.3 mg/dL (ref 0.3–1.2)
Total Protein: 5.4 g/dL — ABNORMAL LOW (ref 6.5–8.1)

## 2023-05-08 LAB — CBC
HCT: 28.9 % — ABNORMAL LOW (ref 36.0–46.0)
Hemoglobin: 8.8 g/dL — ABNORMAL LOW (ref 12.0–15.0)
MCH: 23.5 pg — ABNORMAL LOW (ref 26.0–34.0)
MCHC: 30.4 g/dL (ref 30.0–36.0)
MCV: 77.3 fL — ABNORMAL LOW (ref 80.0–100.0)
Platelets: 170 10*3/uL (ref 150–400)
RBC: 3.74 MIL/uL — ABNORMAL LOW (ref 3.87–5.11)
RDW: 17.4 % — ABNORMAL HIGH (ref 11.5–15.5)
WBC: 13.3 10*3/uL — ABNORMAL HIGH (ref 4.0–10.5)
nRBC: 0.4 % — ABNORMAL HIGH (ref 0.0–0.2)

## 2023-05-08 MED ORDER — IBUPROFEN 800 MG PO TABS: 800.0000 mg | ORAL_TABLET | Freq: Three times a day (TID) | ORAL | 1 refills | Status: AC | PRN

## 2023-05-08 MED ORDER — MEASLES, MUMPS & RUBELLA VAC IJ SOLR
0.5000 mL | Freq: Once | INTRAMUSCULAR | Status: AC
  Administered 2023-05-08: 0.5 mL via SUBCUTANEOUS
  Filled 2023-05-08: qty 0.5

## 2023-05-08 NOTE — Progress Notes (Signed)
Post Partum Day 2 Subjective: No furhter complaints in abdominal pain since yesterday morning. Ambulating without issue, minimal lochia. Bottle feeding currently since latch/pumping still causing cramping. Reviewed normal labs (save for anemia) this AM in regards to h/o elevated LFTs after last delivery  Objective: Patient Vitals for the past 24 hrs:  BP Temp Temp src Pulse Resp SpO2  05/08/23 0513 121/79 98.1 F (36.7 C) Oral 82 18 --  05/07/23 2106 111/75 98.2 F (36.8 C) Oral 78 16 98 %  05/07/23 1442 102/67 98 F (36.7 C) Oral 74 18 100 %  05/07/23 0918 (!) 97/51 98.1 F (36.7 C) Oral 66 16 --     Physical Exam:  Sleeping  Recent Labs    05/06/23 0708 05/07/23 0614 05/08/23 0455  WBC 9.8 14.6* 13.3*  HGB 11.2* 9.1* 8.8*  HCT 36.5 29.2* 28.9*  PLT 189 161 170     Recent Labs    05/06/23 0708 05/08/23 0455  NA 134* 138  K 3.6 3.7  CL 106 110  BUN 8 5*  CREATININE 0.66 0.63  GLUCOSE 114* 82  BILITOT 0.4 0.3  ALT 14 14  AST 23 18  ALKPHOS 226* 132*  PROT 7.2 5.4*  ALBUMIN 2.9* 2.1*     Recent Labs    05/06/23 0708 05/08/23 0455  CALCIUM 9.3 8.5*     No results for input(s): "PROTIME", "APTT", "INR" in the last 72 hours.  No results for input(s): "PROTIME", "APTT", "INR", "FIBRINOGEN" in the last 72 hours. Assessment/Plan: Diane Jackson 37 y.o. [id number] PPD#2 sp SVD 1. PPC: routine PP care 2.  Cramping: routine postpartum pain meds 3. Rh pos, RNI - ppMMR prior to discharge 4. H/o postpartum eclampsia:CMP with normal LFTs, anemia noted. DC home with PO iron, will do 1-2wk BP check given history 5. GDMA2: 2hr GTT at postpartum   LOS: 2 days   Carlisle Cater 05/08/2023, 7:55 AM

## 2023-05-08 NOTE — Discharge Summary (Signed)
Postpartum Discharge Summary  Date of Service updated=     Patient Name: Diane Jackson DOB: [birth year] MRN: [id number]  Date of admission: 05/06/2023 Delivery date:05/06/2023  Delivering provider: Pryor Ochoa [facility]  Date of discharge: 05/08/2023  Admitting diagnosis: Term pregnancy [Z34.90] Intrauterine pregnancy: 109w2d     Secondary diagnosis:  Principal Problem:   Term pregnancy  Additional problems: GDMA2    Discharge diagnosis: Term Pregnancy Delivered and GDM A2                                              Post partum procedures: none Augmentation: AROM and Pitocin Complications: None  Hospital course: Induction of Labor With Vaginal Delivery   37 y.o. yo G2P2002 at [redacted]w[redacted]d was admitted to the hospital 05/06/2023 for induction of labor.  Indication for induction: A2 DM.  Patient had an labor course complicated bynone Membrane Rupture Time/Date: 9:43 AM ,05/06/2023   Delivery Method:Vaginal, Spontaneous  Episiotomy: None  Lacerations:  1st degree;Perineal  Details of delivery can be found in separate delivery note.  Patient had a postpartum course complicated by none. H?o pp PreE, CMP WNL and normotensive in-house. Patient is discharged home 05/08/23.  Newborn Data: Birth date:05/06/2023  Birth time:5:45 PM  Gender:Female  Living status:Living  Apgars:8 ,9  Weight:3572 g   MMR:Yes  Physical exam  Vitals:   05/07/23 0918 05/07/23 1442 05/07/23 2106 05/08/23 0513  BP: (!) 97/51 102/67 111/75 121/79  Pulse: 66 74 78 82  Resp: 16 18 16 18   Temp: 98.1 F (36.7 C) 98 F (36.7 C) 98.2 F (36.8 C) 98.1 F (36.7 C)  TempSrc: Oral Oral Oral Oral  SpO2:  100% 98%   Weight:       General: alert, cooperative, and no distress Lochia: appropriate Uterine Fundus: firm Incision: Healing well with no significant drainage, No significant erythema, Dressing is clean, dry, and intact DVT Evaluation: No evidence of DVT seen on physical exam. Negative Homan's  sign. No cords or calf tenderness. Labs: Lab Results  Component Value Date   WBC 13.3 (H) 05/08/2023   HGB 8.8 (L) 05/08/2023   HCT 28.9 (L) 05/08/2023   MCV 77.3 (L) 05/08/2023   PLT 170 05/08/2023      Latest Ref Rng & Units 05/08/2023    4:55 AM  CMP  Glucose 70 - 99 mg/dL 82   BUN 6 - 20 mg/dL 5   Creatinine 4.09 - 8.11 mg/dL 9.14   Sodium 782 - 956 mmol/L 138   Potassium 3.5 - 5.1 mmol/L 3.7   Chloride 98 - 111 mmol/L 110   CO2 22 - 32 mmol/L 21   Calcium 8.9 - 10.3 mg/dL 8.5   Total Protein 6.5 - 8.1 g/dL 5.4   Total Bilirubin 0.3 - 1.2 mg/dL 0.3   Alkaline Phos 38 - 126 U/L 132   AST 15 - 41 U/L 18   ALT 0 - 44 U/L 14    Edinburgh Score:    05/07/2023    8:58 PM  Edinburgh Postnatal Depression Scale Screening Tool  I have been able to laugh and see the funny side of things. 0  I have looked forward with enjoyment to things. 0  I have blamed myself unnecessarily when things went wrong. 0  I have been anxious or worried for no good reason. 1  I have  felt scared or panicky for no good reason. 0  Things have been getting on top of me. 0  I have been so unhappy that I have had difficulty sleeping. 0  I have felt sad or miserable. 0  I have been so unhappy that I have been crying. 0  The thought of harming myself has occurred to me. 0  Edinburgh Postnatal Depression Scale Total 1      After visit meds:  Allergies as of 05/08/2023       Reactions   Sulfa Antibiotics Swelling   Facial swelling        Medication List     STOP taking these medications    acetaminophen 500 MG tablet Commonly known as: TYLENOL   insulin NPH Human 100 UNIT/ML injection Commonly known as: NOVOLIN N   labetalol 200 MG tablet Commonly known as: NORMODYNE   omeprazole 20 MG capsule Commonly known as: PRILOSEC       TAKE these medications    ibuprofen 800 MG tablet Commonly known as: ADVIL Take 1 tablet (800 mg total) by mouth every 8 (eight) hours as needed. What  changed:  medication strength how much to take when to take this reasons to take this   multivitamin-iron-minerals-folic acid chewable tablet Chew 1 tablet by mouth daily.         Discharge home in stable condition Infant Feeding: Bottle Infant Disposition:home with mother Discharge instruction: per After Visit Summary and Postpartum booklet. Activity: Advance as tolerated. Pelvic rest for 6 weeks.  Diet: carb modified diet Anticipated Birth Control: Unsure Postpartum Appointment:6 weeks Additional Postpartum F/U: 2 hour GTT + BP check Follow up Visit: [facility]   05/08/2023 Carlisle Cater, MD

## 2023-05-11 LAB — GLUCOSE, CAPILLARY: Glucose-Capillary: 100 mg/dL — ABNORMAL HIGH (ref 70–99)

## 2023-05-12 ENCOUNTER — Inpatient Hospital Stay (HOSPITAL_COMMUNITY)
Admission: AD | Admit: 2023-05-12 | Discharge: 2023-05-12 | Disposition: A | Discharge status: Home / Self Care | Payer: 59 | Attending: Obstetrics and Gynecology | Admitting: Obstetrics and Gynecology

## 2023-05-12 ENCOUNTER — Encounter (HOSPITAL_COMMUNITY): Payer: Self-pay | Admitting: Obstetrics and Gynecology

## 2023-05-12 DIAGNOSIS — Z3A40 40 weeks gestation of pregnancy: Secondary | ICD-10-CM

## 2023-05-12 DIAGNOSIS — O9089 Other complications of the puerperium, not elsewhere classified: Secondary | ICD-10-CM | POA: Diagnosis not present

## 2023-05-12 DIAGNOSIS — R748 Abnormal levels of other serum enzymes: Secondary | ICD-10-CM | POA: Diagnosis not present

## 2023-05-12 DIAGNOSIS — M7989 Other specified soft tissue disorders: Secondary | ICD-10-CM | POA: Diagnosis not present

## 2023-05-12 DIAGNOSIS — O1205 Gestational edema, complicating the puerperium: Secondary | ICD-10-CM

## 2023-05-12 DIAGNOSIS — R609 Edema, unspecified: Secondary | ICD-10-CM | POA: Diagnosis not present

## 2023-05-12 LAB — COMPREHENSIVE METABOLIC PANEL
ALT: 75 U/L — ABNORMAL HIGH (ref 0–44)
AST: 37 U/L (ref 15–41)
Albumin: 2.7 g/dL — ABNORMAL LOW (ref 3.5–5.0)
Alkaline Phosphatase: 129 U/L — ABNORMAL HIGH (ref 38–126)
Anion gap: 9 (ref 5–15)
BUN: 9 mg/dL (ref 6–20)
CO2: 25 mmol/L (ref 22–32)
Calcium: 9.8 mg/dL (ref 8.9–10.3)
Chloride: 105 mmol/L (ref 98–111)
Creatinine, Ser: 0.71 mg/dL (ref 0.44–1.00)
GFR, Estimated: >60 mL/min (ref >60)
Glucose, Bld: 130 mg/dL — ABNORMAL HIGH (ref 70–99)
Potassium: 4.5 mmol/L (ref 3.5–5.1)
Sodium: 139 mmol/L (ref 135–145)
Total Bilirubin: 0.6 mg/dL (ref 0.3–1.2)
Total Protein: 6.4 g/dL — ABNORMAL LOW (ref 6.5–8.1)

## 2023-05-12 LAB — CBC
HCT: 38.7 % (ref 36.0–46.0)
Hemoglobin: 11.7 g/dL — ABNORMAL LOW (ref 12.0–15.0)
MCH: 24.7 pg — ABNORMAL LOW (ref 26.0–34.0)
MCHC: 30.2 g/dL (ref 30.0–36.0)
MCV: 81.6 fL (ref 80.0–100.0)
Platelets: 226 10*3/uL (ref 150–400)
RBC: 4.74 MIL/uL (ref 3.87–5.11)
RDW: 20.8 % — ABNORMAL HIGH (ref 11.5–15.5)
WBC: 9.6 10*3/uL (ref 4.0–10.5)
nRBC: 0 % (ref 0.0–0.2)

## 2023-05-12 MED ORDER — FUROSEMIDE 20 MG PO TABS: 20.0000 mg | ORAL_TABLET | Freq: Every day | ORAL | 0 refills | Status: AC

## 2023-05-12 NOTE — MAU Note (Signed)
.  Diane Jackson is a 37 y.o. at Unknown here in MAU reporting: PP vag delivery on 5/23. OB office had her come in because her liver enzymes where elevated. Reports in creased swelling in hands ,feet and face. B/p has been 120's-130's/80's. Denies any headache, or visual changes or abd pin.  Onset of complaint: this week Pain score: 0 Vitals:   05/12/23 1219  BP: 138/84  Pulse: 78  Resp: 18  Temp: 98.2 F (36.8 C)     Lab orders placed from triage:

## 2023-05-12 NOTE — MAU Provider Note (Signed)
History     CSN: [id number]  Arrival date and time: 05/12/23 1133   Event Date/Time   First Provider Initiated Contact with Patient 05/12/23 1252      Chief Complaint  Patient presents with   Hypertension   HPI [name] is a 37 y.o. year old G11P2002 female at 6 days PP who was sent to MAU by her OB reporting elevated liver enzymes on lab results yesterday. She reports that she has increased swelling in hands in both hands, feet and face yesterday; not today. She reports her BPs have been 120s-130s/80s. She denies H/As or visual disturbances or epigastric pain. She had a SVD on 05/06/2023 and was d/c'd home on 05/08/2023 with no medications. Her pregnancy was complicated with GDMA2 and a h/o PEC (1st preg). She receives [facility] with [facility]; next appt is mid-June. Her spouse is present and contributing to the history taking.    OB History     Gravida  2   Para  2   Term  2   Preterm      AB      Living  2      SAB      IAB      Ectopic      Multiple  0   Live Births  2           Past Medical History:  Diagnosis Date   Anemia    Complication of anesthesia    one sided epidural   Gestational diabetes    History of pre-eclampsia in prior pregnancy, currently pregnant    SVD (spontaneous vaginal delivery) 09/17/2018   post partum eclampsia   Vaginal Pap smear, abnormal     Past Surgical History:  Procedure Laterality Date   CERVICAL BIOPSY     WISDOM TOOTH EXTRACTION      Family History  Problem Relation Age of Onset   Arthritis Mother    Hypertension Mother    Hyperlipidemia Mother    Diabetes Mother    Kidney Stones Mother    Cancer Father    Hypertension Father    Hyperlipidemia Father    Diabetes Father    Diabetes Maternal Grandmother    Diabetes Maternal Grandfather    Diabetes Paternal Grandmother    Diabetes Paternal Grandfather     Social History   Tobacco Use   Smoking status: Never   Smokeless  tobacco: Never  Vaping Use   Vaping Use: Never used  Substance Use Topics   Alcohol use: Not Currently   Drug use: Not Currently    Allergies:  Allergies  Allergen Reactions   Sulfa Antibiotics Swelling    Facial swelling    Medications Prior to Admission  Medication Sig Dispense Refill Last Dose   ibuprofen (ADVIL) 800 MG tablet Take 1 tablet (800 mg total) by mouth every 8 (eight) hours as needed. 60 tablet 1 Past Week   multivitamin-iron-minerals-folic acid (CENTRUM) chewable tablet Chew 1 tablet by mouth daily. 30 tablet 1 05/11/2023    Review of Systems  Constitutional: Negative.   HENT: Negative.    Eyes: Negative.   Respiratory: Negative.    Cardiovascular:  Positive for leg swelling.  Gastrointestinal: Negative.   Endocrine: Negative.   Genitourinary: Negative.   Musculoskeletal:  Positive for joint swelling (both hands swelling).  Skin: Negative.   Allergic/Immunologic: Negative.   Neurological: Negative.   Hematological: Negative.   Psychiatric/Behavioral: Negative.     Physical Exam  Patient  Vitals for the past 24 hrs:  BP Temp Pulse Resp  05/12/23 1400 126/80 -- 65 --  05/12/23 1345 124/86 -- 60 --  05/12/23 1330 121/81 -- 64 --  05/12/23 1316 134/81 -- 65 --  05/12/23 1300 (!) 148/84 -- 65 --  05/12/23 1245 134/83 -- 69 --  05/12/23 1230 127/85 -- 72 --  05/12/23 1221 138/85 -- 75 --  05/12/23 1219 138/84 98.2 F (36.8 C) 78 18    Physical Exam Vitals and nursing note reviewed.  Constitutional:      Appearance: Normal appearance. She is normal weight.  Cardiovascular:     Rate and Rhythm: Normal rate.  Pulmonary:     Effort: Pulmonary effort is normal.  Abdominal:     Palpations: Abdomen is soft.  Genitourinary:    Comments: Not indicated Musculoskeletal:        General: No swelling. Normal range of motion.     Right lower leg: No edema.     Left lower leg: No edema.  Skin:    General: Skin is warm and dry.  Neurological:     Mental  Status: She is alert and oriented to person, place, and time.  Psychiatric:        Mood and Affect: Mood normal.        Behavior: Behavior normal.        Thought Content: Thought content normal.        Judgment: Judgment normal.     MAU Course  Procedures  MDM CCUA CBC CMP Serial BP's   *Consult with Dr. Chestine Spore @ 1407 - notified of patient's complaints, assessments, lab & U/S results, tx plan d/c home with Rx for Lasix 20 mg po qd x 5 days, possible BP check at [facility] in 1 week - ok to d/c home, agrees with plan   Results for orders placed or performed during the hospital encounter of 05/12/23 (from the past 24 hour(s))  CBC     Status: Abnormal   Collection Time: 05/12/23 12:00 PM  Result Value Ref Range   WBC 9.6 4.0 - 10.5 K/uL   RBC 4.74 3.87 - 5.11 MIL/uL   Hemoglobin 11.7 (L) 12.0 - 15.0 g/dL   HCT 16.1 09.6 - 04.5 %   MCV 81.6 80.0 - 100.0 fL   MCH 24.7 (L) 26.0 - 34.0 pg   MCHC 30.2 30.0 - 36.0 g/dL   RDW 40.9 (H) 81.1 - 91.4 %   Platelets 226 150 - 400 K/uL   nRBC 0.0 0.0 - 0.2 %  Comprehensive metabolic panel     Status: Abnormal   Collection Time: 05/12/23 12:00 PM  Result Value Ref Range   Sodium 139 135 - 145 mmol/L   Potassium 4.5 3.5 - 5.1 mmol/L   Chloride 105 98 - 111 mmol/L   CO2 25 22 - 32 mmol/L   Glucose, Bld 130 (H) 70 - 99 mg/dL   BUN 9 6 - 20 mg/dL   Creatinine, Ser 7.82 0.44 - 1.00 mg/dL   Calcium 9.8 8.9 - 95.6 mg/dL   Total Protein 6.4 (L) 6.5 - 8.1 g/dL   Albumin 2.7 (L) 3.5 - 5.0 g/dL   AST 37 15 - 41 U/L   ALT 75 (H) 0 - 44 U/L   Alkaline Phosphatase 129 (H) 38 - 126 U/L   Total Bilirubin 0.6 0.3 - 1.2 mg/dL   GFR, Estimated >21 >30 mL/min   Anion gap 9 5 - 15  Assessment and Plan  1. Postpartum edema - Information provided on edema - Rx: Lasix 20 mg po qd x 5 days - Advised to drink plenty of water  - Advised to call OB office to see if they want to see her next week for a BP check in the office or if they are ok with  her checking her BP at home.  2. Elevated liver enzymes - Advised that LE are trending downward  - Discharge patient - Keep scheduled appt with [facility] in mid-June - Patient verbalized an understanding of the plan of care and agrees.     Diane Jackson, CNM 05/12/2023, 12:53 PM

## 2023-05-12 NOTE — Discharge Instructions (Signed)
Make sure to drink plenty of water during the FIVE days of taking the Lasix (Furosemide).

## 2023-05-14 ENCOUNTER — Telehealth (HOSPITAL_COMMUNITY): Payer: Self-pay | Admitting: *Deleted

## 2023-05-14 NOTE — Telephone Encounter (Signed)
Left phone voicemail message.  Duffy Rhody, RN 05-14-2023 at 10:23am
